# Patient Record
Sex: Male | Born: 1985 | Race: White | Hispanic: No | Marital: Single | Smoking: Current every day smoker
Health system: Southern US, Community
[De-identification: ages and names within clinical notes are randomized; demographics above are authoritative.]

---

## 2007-07-31 ENCOUNTER — Emergency Department: Payer: Self-pay | Admitting: Unknown Physician Specialty

## 2010-06-28 ENCOUNTER — Emergency Department: Payer: Self-pay | Admitting: Internal Medicine

## 2011-01-01 ENCOUNTER — Emergency Department: Payer: Self-pay | Admitting: Unknown Physician Specialty

## 2011-01-31 ENCOUNTER — Emergency Department: Payer: Self-pay | Admitting: Emergency Medicine

## 2011-08-20 ENCOUNTER — Emergency Department: Payer: Self-pay | Admitting: Emergency Medicine

## 2011-10-20 ENCOUNTER — Emergency Department: Payer: Self-pay | Admitting: Emergency Medicine

## 2012-12-03 ENCOUNTER — Emergency Department: Payer: Self-pay | Admitting: Emergency Medicine

## 2012-12-03 LAB — CBC
MCHC: 35.1 g/dL (ref 32.0–36.0)
MCV: 88 fL (ref 80–100)
Platelet: 248 10*3/uL (ref 150–440)
RDW: 13.3 % (ref 11.5–14.5)

## 2012-12-03 LAB — BASIC METABOLIC PANEL
Anion Gap: 6 — ABNORMAL LOW (ref 7–16)
BUN: 10 mg/dL (ref 7–18)
Calcium, Total: 9.4 mg/dL (ref 8.5–10.1)
Chloride: 105 mmol/L (ref 98–107)
Co2: 28 mmol/L (ref 21–32)
EGFR (African American): >60
EGFR (Non-African Amer.): >60
Glucose: 87 mg/dL (ref 65–99)
Sodium: 139 mmol/L (ref 136–145)

## 2012-12-03 LAB — TROPONIN I: Troponin-I: <0.02 ng/mL

## 2013-04-30 ENCOUNTER — Emergency Department: Payer: Self-pay | Admitting: Emergency Medicine

## 2013-09-23 ENCOUNTER — Emergency Department: Payer: Self-pay | Admitting: Emergency Medicine

## 2014-07-22 ENCOUNTER — Emergency Department: Payer: Self-pay | Admitting: Emergency Medicine

## 2014-10-20 ENCOUNTER — Encounter: Payer: Self-pay | Admitting: Urgent Care

## 2014-10-20 DIAGNOSIS — S40862A Insect bite (nonvenomous) of left upper arm, initial encounter: Secondary | ICD-10-CM | POA: Insufficient documentation

## 2014-10-20 DIAGNOSIS — S30861A Insect bite (nonvenomous) of abdominal wall, initial encounter: Secondary | ICD-10-CM | POA: Insufficient documentation

## 2014-10-20 DIAGNOSIS — Y9289 Other specified places as the place of occurrence of the external cause: Secondary | ICD-10-CM | POA: Insufficient documentation

## 2014-10-20 DIAGNOSIS — R221 Localized swelling, mass and lump, neck: Secondary | ICD-10-CM | POA: Insufficient documentation

## 2014-10-20 DIAGNOSIS — Y9389 Activity, other specified: Secondary | ICD-10-CM | POA: Insufficient documentation

## 2014-10-20 DIAGNOSIS — W57XXXA Bitten or stung by nonvenomous insect and other nonvenomous arthropods, initial encounter: Secondary | ICD-10-CM | POA: Insufficient documentation

## 2014-10-20 DIAGNOSIS — K011 Impacted teeth: Secondary | ICD-10-CM | POA: Insufficient documentation

## 2014-10-20 DIAGNOSIS — Y998 Other external cause status: Secondary | ICD-10-CM | POA: Insufficient documentation

## 2014-10-20 NOTE — ED Notes (Signed)
Patient presents with c/o bug bites to his LUE and LEFT flank area - reports that areas were turning black; no black areas noted in triage. Patient also c/o pain to bilateral jaws secondary to wisdom teeth - reports that it is making his tongue swell. NAD noted in triage - patient with speech WNL, able to speak in complete sentences, and no signs consistent with increased WOB or respiratory distress. NOS reported at this time.

## 2014-10-21 ENCOUNTER — Emergency Department
Admission: EM | Admit: 2014-10-21 | Discharge: 2014-10-21 | Discharge status: Against Medical Advice | Payer: Self-pay | Attending: Emergency Medicine | Admitting: Emergency Medicine

## 2014-11-09 ENCOUNTER — Emergency Department
Admission: EM | Admit: 2014-11-09 | Discharge: 2014-11-10 | Disposition: A | Discharge status: Home / Self Care | Payer: Self-pay | Attending: Emergency Medicine | Admitting: Emergency Medicine

## 2014-11-09 ENCOUNTER — Encounter: Payer: Self-pay | Admitting: Emergency Medicine

## 2014-11-09 DIAGNOSIS — K088 Other specified disorders of teeth and supporting structures: Secondary | ICD-10-CM | POA: Insufficient documentation

## 2014-11-09 DIAGNOSIS — K0889 Other specified disorders of teeth and supporting structures: Secondary | ICD-10-CM

## 2014-11-09 DIAGNOSIS — Z720 Tobacco use: Secondary | ICD-10-CM | POA: Insufficient documentation

## 2014-11-09 DIAGNOSIS — K121 Other forms of stomatitis: Secondary | ICD-10-CM | POA: Insufficient documentation

## 2014-11-09 NOTE — ED Notes (Signed)
Patient ambulatory to triage with steady gait, without difficulty or distress noted; pt reports mouth ulcers for last few days

## 2014-11-09 NOTE — ED Notes (Signed)
Pt here with ulcers in his mouth.  Pt states that they are also on his tongue.  Pt states that he has had them for 2 days.  Pt has not seen PCP, pt has not used any OTC meds. Pt talking in complete sentences and in NAD at this time

## 2014-11-10 MED ORDER — TRAMADOL HCL 50 MG PO TABS
50.0000 mg | ORAL_TABLET | Freq: Once | ORAL | Status: AC
  Administered 2014-11-10: 50 mg via ORAL

## 2014-11-10 MED ORDER — TRAMADOL HCL 50 MG PO TABS
ORAL_TABLET | ORAL | Status: AC
  Administered 2014-11-10: 50 mg via ORAL
  Filled 2014-11-10: qty 1

## 2014-11-10 MED ORDER — IBUPROFEN 800 MG PO TABS
ORAL_TABLET | ORAL | Status: AC
  Filled 2014-11-10: qty 1

## 2014-11-10 MED ORDER — IBUPROFEN 800 MG PO TABS
ORAL_TABLET | ORAL | Status: AC
  Administered 2014-11-10: 800 mg via ORAL
  Filled 2014-11-10: qty 1

## 2014-11-10 MED ORDER — TRAMADOL HCL 50 MG PO TABS: 50.0000 mg | ORAL_TABLET | Freq: Four times a day (QID) | ORAL | Status: DC | PRN

## 2014-11-10 MED ORDER — LIDOCAINE VISCOUS 2 % MT SOLN
OROMUCOSAL | Status: AC
  Filled 2014-11-10: qty 15

## 2014-11-10 MED ORDER — IBUPROFEN 800 MG PO TABS
800.0000 mg | ORAL_TABLET | Freq: Once | ORAL | Status: AC
  Administered 2014-11-10: 800 mg via ORAL

## 2014-11-10 MED ORDER — LIDOCAINE VISCOUS 2 % MT SOLN
15.0000 mL | Freq: Once | OROMUCOSAL | Status: AC
  Administered 2014-11-10: 15 mL via OROMUCOSAL

## 2014-11-10 MED ORDER — LIDOCAINE VISCOUS 2 % MT SOLN
OROMUCOSAL | Status: AC
Start: 2014-11-10 — End: 2014-11-10
  Administered 2014-11-10: 15 mL via OROMUCOSAL
  Filled 2014-11-10: qty 15

## 2014-11-10 MED ORDER — TRAMADOL HCL 50 MG PO TABS
ORAL_TABLET | ORAL | Status: AC
  Filled 2014-11-10: qty 1

## 2014-11-10 MED ORDER — LIDOCAINE VISCOUS 2 % MT SOLN: 15.0000 mL | OROMUCOSAL | Status: DC | PRN

## 2014-11-10 NOTE — Discharge Instructions (Signed)
Oral Ulcers Oral ulcers are painful, shallow sores around the lining of the mouth. They can affect the gums, the inside of the lips, and the cheeks. (Sores on the outside of the lips and on the face are different.) They typically first occur in school-aged children and teenagers. Oral ulcers may also be called canker sores or cold sores. CAUSES  Canker sores and cold sores can be caused by many factors including:  Infection.  Injury.  Sun exposure.  Medications.  Emotional stress.  Food allergies.  Vitamin deficiencies.  Toothpastes containing sodium lauryl sulfate. The herpes virus can be the cause of mouth ulcers. The first infection can be severe and cause 10 or more ulcers on the gums, tongue, and lips with fever and difficulty in swallowing. This infection usually occurs between the ages of 75 and 3 years.  SYMPTOMS  The typical sore is about  inch (6 mm) in size and is an oval or round ulcer with red borders. DIAGNOSIS  Your caregiver can diagnose simple oral ulcers by examination. Additional testing is usually not required.  TREATMENT  Treatment is aimed at pain relief. Generally, oral ulcers resolve by themselves within 1 to 2 weeks without medication and are not contagious unless caused by herpes (and other viruses). Antibiotics are not effective with mouth sores. Avoid direct contact with others until the ulcer is completely healed. See your caregiver for follow-up care as recommended. Also:  Offer a soft diet.  Encourage plenty of fluids to prevent dehydration. Popsicles and milk shakes can be helpful.  Avoid acidic and salty foods and drinks such as orange juice.  Infants and young children will often refuse to drink because of pain. Using a teaspoon, cup, or syringe to give small amounts of fluids frequently can help prevent dehydration.  Cold compresses on the face may help reduce pain.  Pain medication can help control soreness.  A solution of diphenhydramine  mixed with a liquid antacid can be useful to decrease the soreness of ulcers. Consult a caregiver for the dosing.  Liquids or ointments with a numbing ingredient may be helpful when used as recommended.  Older children and teenagers can rinse their mouth with a salt-water mixture (1/2 teaspoon of salt in 8 ounces of water) four times a day. This treatment is uncomfortable but may reduce the time the ulcers are present.  There are many over-the-counter throat lozenges and medications available for oral ulcers. Their effectiveness has not been studied.  Consult your medical caregiver prior to using homeopathic treatments for oral ulcers. SEEK MEDICAL CARE IF:   You think your child needs to be seen.  The pain worsens and you cannot control it.  There are 4 or more ulcers.  The lips and gums begin to bleed and crust.  A single mouth ulcer is near a tooth that is causing a toothache or pain.  Your child has a fever, swollen face, or swollen glands.  The ulcers began after starting a medication.  Mouth ulcers keep reoccurring or last more than 2 weeks.  You think your child is not taking adequate fluids. SEEK IMMEDIATE MEDICAL CARE IF:   Your child has a high fever.  Your child is unable to swallow or becomes dehydrated.  Your child looks or acts very ill.  An ulcer caused by a chemical your child accidentally put in their mouth. Document Released: 06/15/2004 Document Revised: 09/22/2013 Document Reviewed: 01/28/2009 [facility] Patient Information 2015 [place], [place]. This information is not intended to replace advice  given to you by your health care provider. Make sure you discuss any questions you have with your health care provider.  Dental Pain A tooth ache may be caused by cavities (tooth decay). Cavities expose the nerve of the tooth to air and hot or cold temperatures. It may come from an infection or abscess (also called a boil or furuncle) around your tooth. It is also  often caused by dental caries (tooth decay). This causes the pain you are having. DIAGNOSIS  Your caregiver can diagnose this problem by exam. TREATMENT   If caused by an infection, it may be treated with medications which kill germs (antibiotics) and pain medications as prescribed by your caregiver. Take medications as directed.  Only take over-the-counter or prescription medicines for pain, discomfort, or fever as directed by your caregiver.  Whether the tooth ache today is caused by infection or dental disease, you should see your dentist as soon as possible for further care. SEEK MEDICAL CARE IF: The exam and treatment you received today has been provided on an emergency basis only. This is not a substitute for complete medical or dental care. If your problem worsens or new problems (symptoms) appear, and you are unable to meet with your dentist, call or return to this location. SEEK IMMEDIATE MEDICAL CARE IF:   You have a fever.  You develop redness and swelling of your face, jaw, or neck.  You are unable to open your mouth.  You have severe pain uncontrolled by pain medicine. MAKE SURE YOU:   Understand these instructions.  Will watch your condition.  Will get help right away if you are not doing well or get worse. Document Released: 05/08/2005 Document Revised: 07/31/2011 Document Reviewed: 12/25/2007 [facility] Patient Information 2015 [place], [place]. This information is not intended to replace advice given to you by your health care provider. Make sure you discuss any questions you have with your health care provider.  Stomatitis  Stomatitis is redness, soreness, and puffiness (inflammation) of the lining of the mouth. This problem can also affect your cheeks, teeth, gums, lips, or tongue. Painful sores (ulcers) can also show up in the mouth. HOME CARE  Brush your teeth gently with a soft toothbrush.  Floss at least 2 times a day.  Clean your mouth after  eating.  Rinse your mouth with salt water 3 to 4 times a day.  Gargle with cold water.  Use medicated creams to lessen pain as told by your doctor.  Do not smoke or use chewing tobacco.  Avoid eating hot and spicy foods.  Eat soft and bland foods.  Lessen your stress.  Eat healthy foods. GET HELP RIGHT AWAY IF:  You have a fever.  You have pain, redness, or sores around one or both eyes.  You cannot eat or drink.  You feel tired, weak, or you pass out (faint).  You throw up (vomit), or you have watery poop (diarrhea).  You have chest pain, shortness of breath, or a fast and irregular heartbeat (pulse).  Your problems continue or get worse.  You have new problems.  You have mouth sores for longer than 3 weeks.  Your mouth sores come back often.  You stop feeling hungry or feel sick to your stomach (nauseous). MAKE SURE YOU:  Understand these instructions.  Will watch your condition.  Will get help right away if you are not doing well or get worse. Document Released: 04/27/2011 Document Revised: 07/31/2011 Document Reviewed: 04/27/2011 [employer] Patient Information 2015 [place]  [place]. This information is not intended to replace advice given to you by your health care provider. Make sure you discuss any questions you have with your health care provider.

## 2014-11-10 NOTE — ED Provider Notes (Signed)
[facility] Emergency Department Provider Note  ____________________________________________  Time seen: Approximately 0033 AM  I have reviewed the triage vital signs and the nursing notes.   HISTORY  Chief Complaint Mouth Lesions    HPI ADHRITH WIERMAN is a 29 y.o. male who comes in today with ulcers in his mouth and pain from his wisdom teeth. The patient reports that he has had ulcers in his mouth before but is never seen a moist tongue. The patient reports he is not taking anything for pain at home. He reports that he noticed the symptoms 2 days ago. He reports that the last time he was here he was told to stay away from Orajel mouthwash so he came in for something for pain. He reports that his pain is a 6 out of 10 in intensity. He also reports that his wisdom teeth are coming in and they're causing him some pain as well. The patient reports that the ulcers in his mouth or worse when he eats. He reports that he has a few on his time in a few on the inside of his lip and some in the back of his mouth as well.   History reviewed. No pertinent past medical history.  There are no active problems to display for this patient.   History reviewed. No pertinent past surgical history.  Current Outpatient Rx  Name  Route  Sig  Dispense  Refill  . lidocaine (XYLOCAINE) 2 % solution   Mouth/Throat   Use as directed 15 mLs in the mouth or throat every 4 (four) hours as needed for mouth pain (swish and spit).   100 mL   0   . traMADol (ULTRAM) 50 MG tablet   Oral   Take 1 tablet (50 mg total) by mouth every 6 (six) hours as needed.   12 tablet   0     Allergies Review of patient's allergies indicates no known allergies.  No family history on file.  Social History History  Substance Use Topics  . Smoking status: Current Every Day Smoker -- 1.00 packs/day    Types: Cigarettes  . Smokeless tobacco: Not on file  . Alcohol Use: No    Review of  Systems Constitutional: No fever/chills Eyes: No visual changes. ENT: Mouth ulcers and dental pain Cardiovascular: Denies chest pain. Respiratory: Denies shortness of breath. Gastrointestinal: No abdominal pain.  No nausea, no vomiting.  No diarrhea.  No constipation. Genitourinary: Negative for dysuria. Musculoskeletal: Negative for back pain. Skin: Negative for rash. Neurological: Negative for headaches,   10-point ROS otherwise negative.  ____________________________________________   PHYSICAL EXAM:  VITAL SIGNS: ED Triage Vitals  Enc Vitals Group     BP 11/09/14 2306 123/69 mmHg     Pulse Rate 11/09/14 2306 70     Resp 11/09/14 2306 17     Temp 11/09/14 2306 98.4 F (36.9 C)     Temp Source 11/09/14 2306 Oral     SpO2 11/09/14 2306 98 %     Weight 11/09/14 2306 168 lb (76.204 kg)     Height 11/09/14 2306 6' (1.829 m)     Head Cir --      Peak Flow --      Pain Score 11/09/14 2311 6     Pain Loc --      Pain Edu? --      Excl. in GC? --     Constitutional: Alert and oriented. Well appearing and in mild distress. Eyes:  Conjunctivae are normal. PERRL. EOMI. Head: Atraumatic. Nose: No congestion/rhinnorhea. Mouth/Throat: Mucosal ulcers on the inside of the patient's lower lip to ulcers noted in the tip of the patient's tCardiovascular: Normal rate, regular rhythm. Grossly normal heart sounds.  Good peripheral circulation. Respiratory: Normal respiratory effort.  No retractions. Lungs CTAB. Gastrointestinal: Soft and nontender. No distention. Positive bowel sounds Genitourinary: Deferred Musculoskeletal: No lower extremity tenderness nor edema.  No joint effusions. Neurologic:  Normal speech and language. No gross focal neurologic deficits are appreciated.  Skin:  Skin is warm, dry and intact. No rash noted. Psychiatric: Mood and affect are normal.   ____________________________________________   LABS (all labs ordered are listed, but only abnormal results are  displayed)  Labs Reviewed - No data to display ____________________________________________  EKG  None ____________________________________________  RADIOLOGY  None ____________________________________________   PROCEDURES  Procedure(s) performed: None  Critical Care performed: No  ____________________________________________   INITIAL IMPRESSION / ASSESSMENT AND PLAN / ED COURSE  Pertinent labs & imaging results that were available during my care of the patient were reviewed by me and considered in my medical decision making (see chart for details).  This is a 29 year old male who comes in with ulcers in his mouth. The patient reports he has some often. The ulcers do give the appearance of herpetic gingivostomatitis. I will give the patient some viscous lidocaine to swish and spit and give him some Toradol and tramadol as well for the pain. The patient will be discharged home to follow-up with [facility] care clinic ____________________________________________   FINAL CLINICAL IMPRESSION(S) / ED DIAGNOSES  Final diagnoses:  Mouth ulcers  Pain, dental      Rebecka Apley, MD 11/10/14 [phone]

## 2015-05-06 ENCOUNTER — Emergency Department
Admission: EM | Admit: 2015-05-06 | Discharge: 2015-05-06 | Disposition: A | Discharge status: Home / Self Care | Payer: Self-pay | Attending: Emergency Medicine | Admitting: Emergency Medicine

## 2015-05-06 ENCOUNTER — Emergency Department: Payer: Self-pay

## 2015-05-06 DIAGNOSIS — F17210 Nicotine dependence, cigarettes, uncomplicated: Secondary | ICD-10-CM | POA: Insufficient documentation

## 2015-05-06 DIAGNOSIS — R079 Chest pain, unspecified: Secondary | ICD-10-CM

## 2015-05-06 DIAGNOSIS — B029 Zoster without complications: Secondary | ICD-10-CM

## 2015-05-06 DIAGNOSIS — R61 Generalized hyperhidrosis: Secondary | ICD-10-CM | POA: Insufficient documentation

## 2015-05-06 LAB — CBC WITH DIFFERENTIAL/PLATELET
Basophils Absolute: 0.1 10*3/uL (ref 0–0.1)
Basophils Relative: 1 %
Eosinophils Absolute: 0.2 10*3/uL (ref 0–0.7)
Eosinophils Relative: 2 %
HEMATOCRIT: 47.3 % (ref 40.0–52.0)
Hemoglobin: 16.1 g/dL (ref 13.0–18.0)
LYMPHS PCT: 22 %
Lymphs Abs: 2.6 10*3/uL (ref 1.0–3.6)
MCH: 30.3 pg (ref 26.0–34.0)
MCHC: 34 g/dL (ref 32.0–36.0)
MCV: 89.4 fL (ref 80.0–100.0)
Monocytes Absolute: 1.2 10*3/uL — ABNORMAL HIGH (ref 0.2–1.0)
Monocytes Relative: 10 %
NEUTROS ABS: 7.9 10*3/uL — AB (ref 1.4–6.5)
Neutrophils Relative %: 65 %
Platelets: 241 10*3/uL (ref 150–440)
RBC: 5.3 MIL/uL (ref 4.40–5.90)
RDW: 13.3 % (ref 11.5–14.5)
WBC: 12 10*3/uL — AB (ref 3.8–10.6)

## 2015-05-06 LAB — BASIC METABOLIC PANEL
Anion gap: 4 — ABNORMAL LOW (ref 5–15)
BUN: 13 mg/dL (ref 6–20)
CALCIUM: 9.2 mg/dL (ref 8.9–10.3)
CO2: 30 mmol/L (ref 22–32)
Chloride: 100 mmol/L — ABNORMAL LOW (ref 101–111)
Creatinine, Ser: 0.91 mg/dL (ref 0.61–1.24)
GFR calc Af Amer: >60 mL/min (ref >60)
GFR calc non Af Amer: >60 mL/min (ref >60)
GLUCOSE: 89 mg/dL (ref 65–99)
Potassium: 4 mmol/L (ref 3.5–5.1)
Sodium: 134 mmol/L — ABNORMAL LOW (ref 135–145)

## 2015-05-06 LAB — TROPONIN I: Troponin I: <0.03 ng/mL (ref <0.031)

## 2015-05-06 MED ORDER — IBUPROFEN 800 MG PO TABS
800.0000 mg | ORAL_TABLET | Freq: Once | ORAL | Status: AC
  Administered 2015-05-06: 800 mg via ORAL
  Filled 2015-05-06: qty 1

## 2015-05-06 MED ORDER — ACYCLOVIR 200 MG PO CAPS
800.0000 mg | ORAL_CAPSULE | Freq: Once | ORAL | Status: AC
  Administered 2015-05-06: 800 mg via ORAL
  Filled 2015-05-06: qty 4

## 2015-05-06 MED ORDER — OXYCODONE-ACETAMINOPHEN 5-325 MG PO TABS: 1.0000 | ORAL_TABLET | Freq: Four times a day (QID) | ORAL | Status: DC | PRN

## 2015-05-06 MED ORDER — ACYCLOVIR 200 MG PO CAPS: 800.0000 mg | ORAL_CAPSULE | Freq: Every day | ORAL | Status: DC

## 2015-05-06 NOTE — ED Provider Notes (Signed)
[facility] Emergency Department Provider Note  ____________________________________________  Time seen: Approximately 315 AM  I have reviewed the triage vital signs and the nursing notes.   HISTORY  Chief Complaint Chest Pain    HPI Joe Maldonado is a 29 y.o. male without any chronic medical problems who is presenting today with central left-sided chest pain over the past 2 months. He says that the chest pain lasts for several seconds at a time and is worse when he is sitting or lying back. He says that it is pressure-like. Denies any shortness of breath and says he does have sweating with it sometimes. The patient says that he smokes but does not have any other chronic diseases. Denies any history of heart attacks in his family. Is concerned because he says his girlfriend, who is 60, died of a heart attack recently. He denies any illicit drug use and says he only drinks occasionally. He says also over the past 2 days he has noticed a rash to his back and also the left side of his chest. He says that it is a burning pain and he is experiencing to the rash and says that he sees blisters over the rash on his back. He says he has been very stressed since the death of his girlfriend 5 months ago and has not been sleeping recently.Denies that the chest pain is any worse with exertion.   No past medical history on file.  There are no active problems to display for this patient.   No past surgical history on file.  Current Outpatient Rx  Name  Route  Sig  Dispense  Refill  . lidocaine (XYLOCAINE) 2 % solution   Mouth/Throat   Use as directed 15 mLs in the mouth or throat every 4 (four) hours as needed for mouth pain (swish and spit).   100 mL   0   . traMADol (ULTRAM) 50 MG tablet   Oral   Take 1 tablet (50 mg total) by mouth every 6 (six) hours as needed.   12 tablet   0     Allergies Review of patient's allergies indicates no known allergies.  No  family history on file.  Social History Social History  Substance Use Topics  . Smoking status: Current Every Day Smoker -- 1.00 packs/day    Types: Cigarettes  . Smokeless tobacco: Not on file  . Alcohol Use: No    Review of Systems Constitutional: No fever/chills Eyes: No visual changes. ENT: No sore throat. Cardiovascular: As above Respiratory: Denies shortness of breath. Gastrointestinal: No abdominal pain.  No nausea, no vomiting.  No diarrhea.  No constipation. Genitourinary: Negative for dysuria. Musculoskeletal: Negative for back pain. Skin: As above  Neurological: Negative for headaches, focal weakness or numbness.  10-point ROS otherwise negative.  ____________________________________________   PHYSICAL EXAM:  VITAL SIGNS: ED Triage Vitals  Enc Vitals Group     BP 05/06/15 0306 117/77 mmHg     Pulse Rate 05/06/15 0306 91     Resp 05/06/15 0306 18     Temp 05/06/15 0306 98.4 F (36.9 C)     Temp Source 05/06/15 0306 Oral     SpO2 05/06/15 0306 99 %     Weight 05/06/15 0306 160 lb (72.576 kg)     Height 05/06/15 0306 6' (1.829 m)     Head Cir --      Peak Flow --      Pain Score 05/06/15 0306 8  Pain Loc --      Pain Edu? --      Excl. in GC? --     Constitutional: Alert and oriented. Well appearing and in no acute distress. Eyes: Conjunctivae are normal. PERRL. EOMI. Head: Atraumatic. Nose: No congestion/rhinnorhea. Mouth/Throat: Mucous membranes are moist.  Oropharynx non-erythematous. Neck: No stridor.   Cardiovascular: Normal rate, regular rhythm. Grossly normal heart sounds.  Good peripheral circulation. Respiratory: Normal respiratory effort.  No retractions. Lungs CTAB. Gastrointestinal: Soft and nontender. No distention. No abdominal bruits. No CVA tenderness. Musculoskeletal: No lower extremity tenderness nor edema.  No joint effusions. Neurologic:  Normal speech and language. No gross focal neurologic deficits are appreciated. No gait  instability. Skin:  Skin is warm, dry and intact. Dermatomal he distributed rash to the left thoracic back with vesicles. The rash is red and extends about 10 cm laterally starting at the midline. There is also another patch of erythematous, raised and rough rash just lateral to the left nipple which appears no same dermatome is on the back.  Psychiatric: Mood and affect are normal. Speech and behavior are normal.  ____________________________________________   LABS (all labs ordered are listed, but only abnormal results are displayed)  Labs Reviewed  CBC WITH DIFFERENTIAL/PLATELET - Abnormal; Notable for the following:    WBC 12.0 (*)    Neutro Abs 7.9 (*)    Monocytes Absolute 1.2 (*)    All other components within normal limits  BASIC METABOLIC PANEL - Abnormal; Notable for the following:    Sodium 134 (*)    Chloride 100 (*)    Anion gap 4 (*)    All other components within normal limits  TROPONIN I   ____________________________________________  EKG  ED ECG REPORT I, Arelia Longest, the attending physician, personally viewed and interpreted this ECG.   Date: 05/06/2015  EKG Time: 411  Rate: 79  Rhythm: normal EKG, normal sinus rhythm  Axis: Normal  Intervals:none  ST&T Change: Diffuse, concave ST elevation likely consistent with early repolarization. No abnormal T-wave inversions. No ST depression.  ____________________________________________  RADIOLOGY  No active cardiopulmonary disease. I personally reviewed this film   ____________________________________________   PROCEDURES   ____________________________________________   INITIAL IMPRESSION / ASSESSMENT AND PLAN / ED COURSE  Pertinent labs & imaging results that were available during my care of the patient were reviewed by me and considered in my medical decision making (see chart for details).  PERC negative.   ----------------------------------------- 4:47 AM on  05/06/2015 -----------------------------------------  Patient resting without any outward signs of distress but says that the left side of his chest and his back with her rashes is still hurting him. I will give him a prescriptive for acyclovir as well as Percocet. Chest pain highly unlikely to be life-threatening causes a blood clot or ACS. We'll treat shingles as well as give the patient follow-up with primary care. Will be discharged home. Patient says that he does not have a regular exposure to any young children or elderly people. He is aware that the rash is highly contagious and that known to be touching it and that if he touches it that he should be washing his hands afterwards. Patient says that he is also been taking a generic acid reducer for about 3 months for the chest pain which he says has not been helping. This lessens the probability of this is related to reflux. ____________________________________________   FINAL CLINICAL IMPRESSION(S) / ED DIAGNOSES  Uncomplicated herpes zoster. Chest  pain.    Myrna Blazeravid Matthew Phelan Schadt, MD 05/06/15 [phone]

## 2015-05-06 NOTE — ED Notes (Signed)
Pt in with co chest pain x 2 months and rash to back and chest x 2 days.

## 2016-05-07 IMAGING — CR DG CHEST 2V
1 series · 2 of 2 positions shown · non-contrast
Comparison: 12/03/2012

CLINICAL DATA: Chest pain. Rash in the back and chest for 2 days.
Smoker.

EXAM:
CHEST  2 VIEW

[Series 1: dg chest 2 view · 0.14mm/px · 2 of 2 slices shown]
[im 1/2]
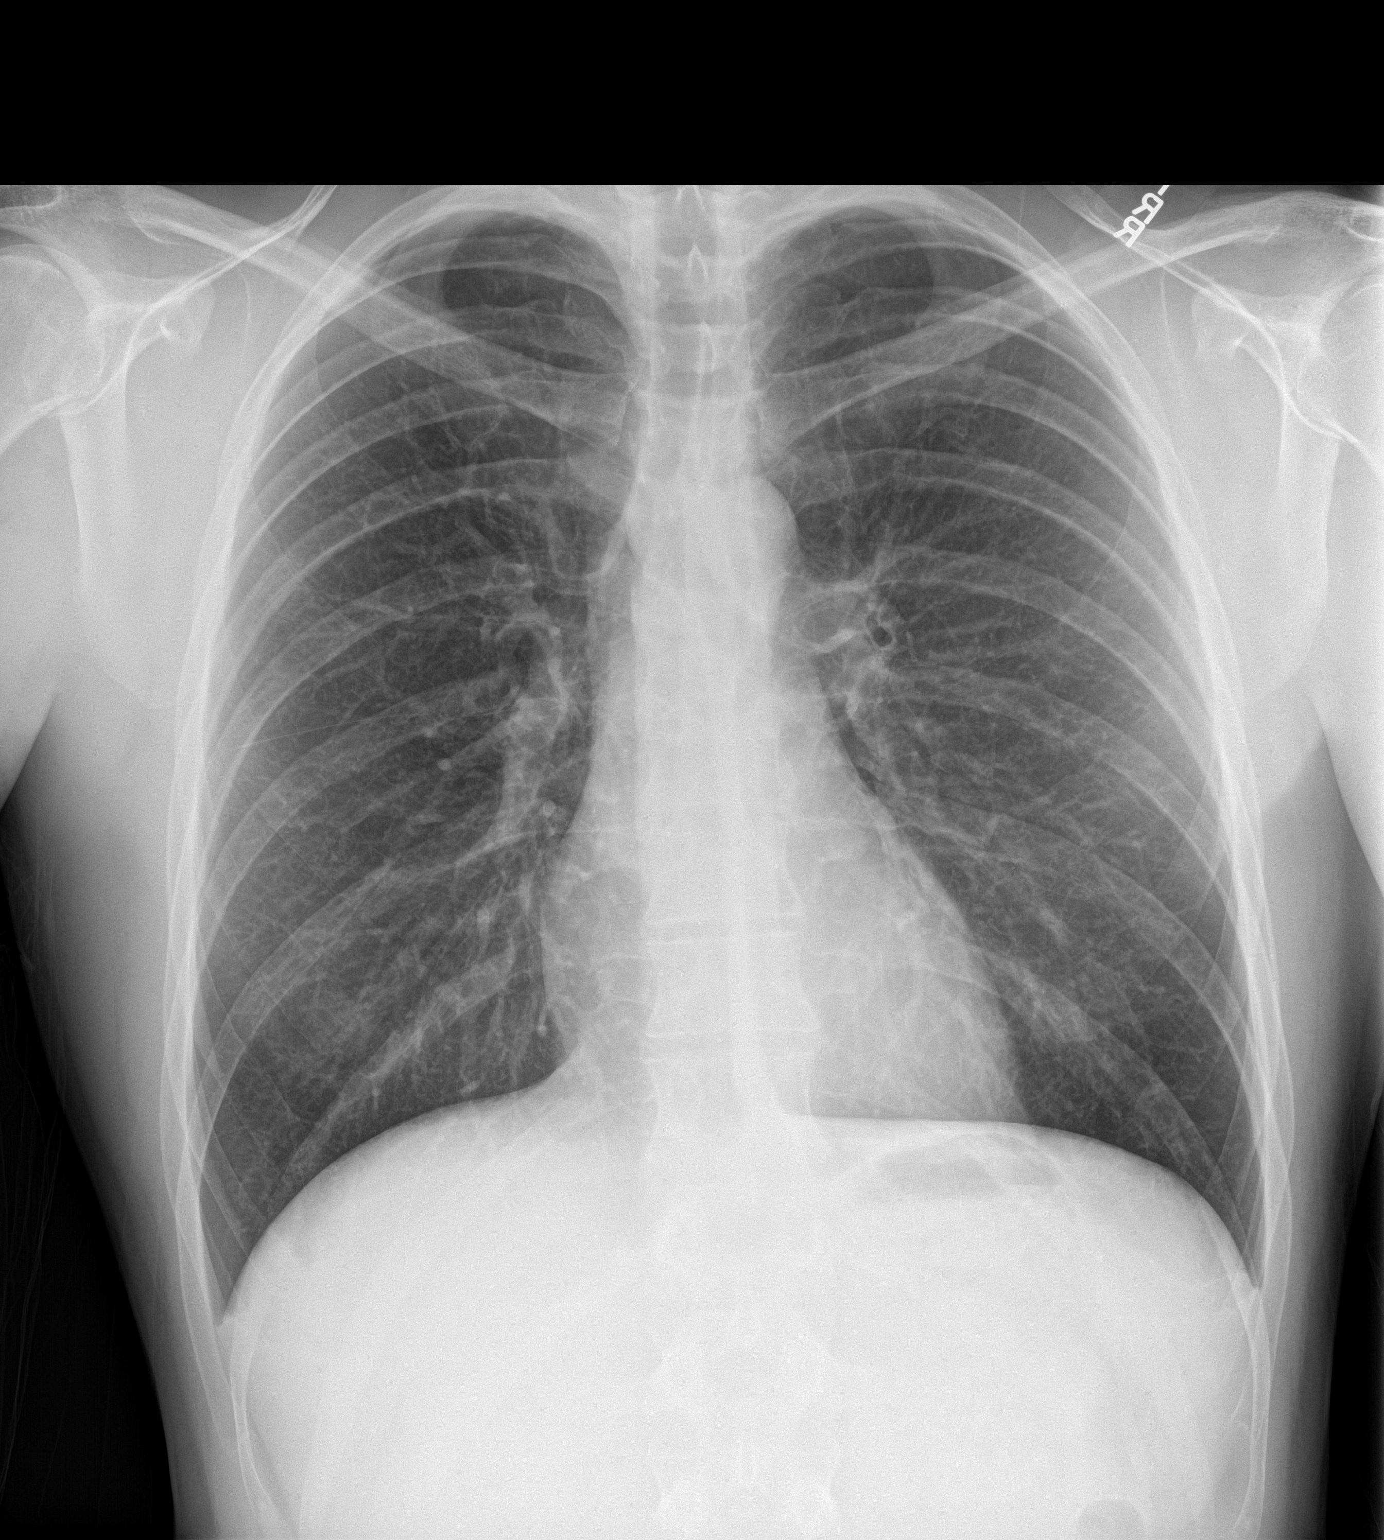
[im 2/2]
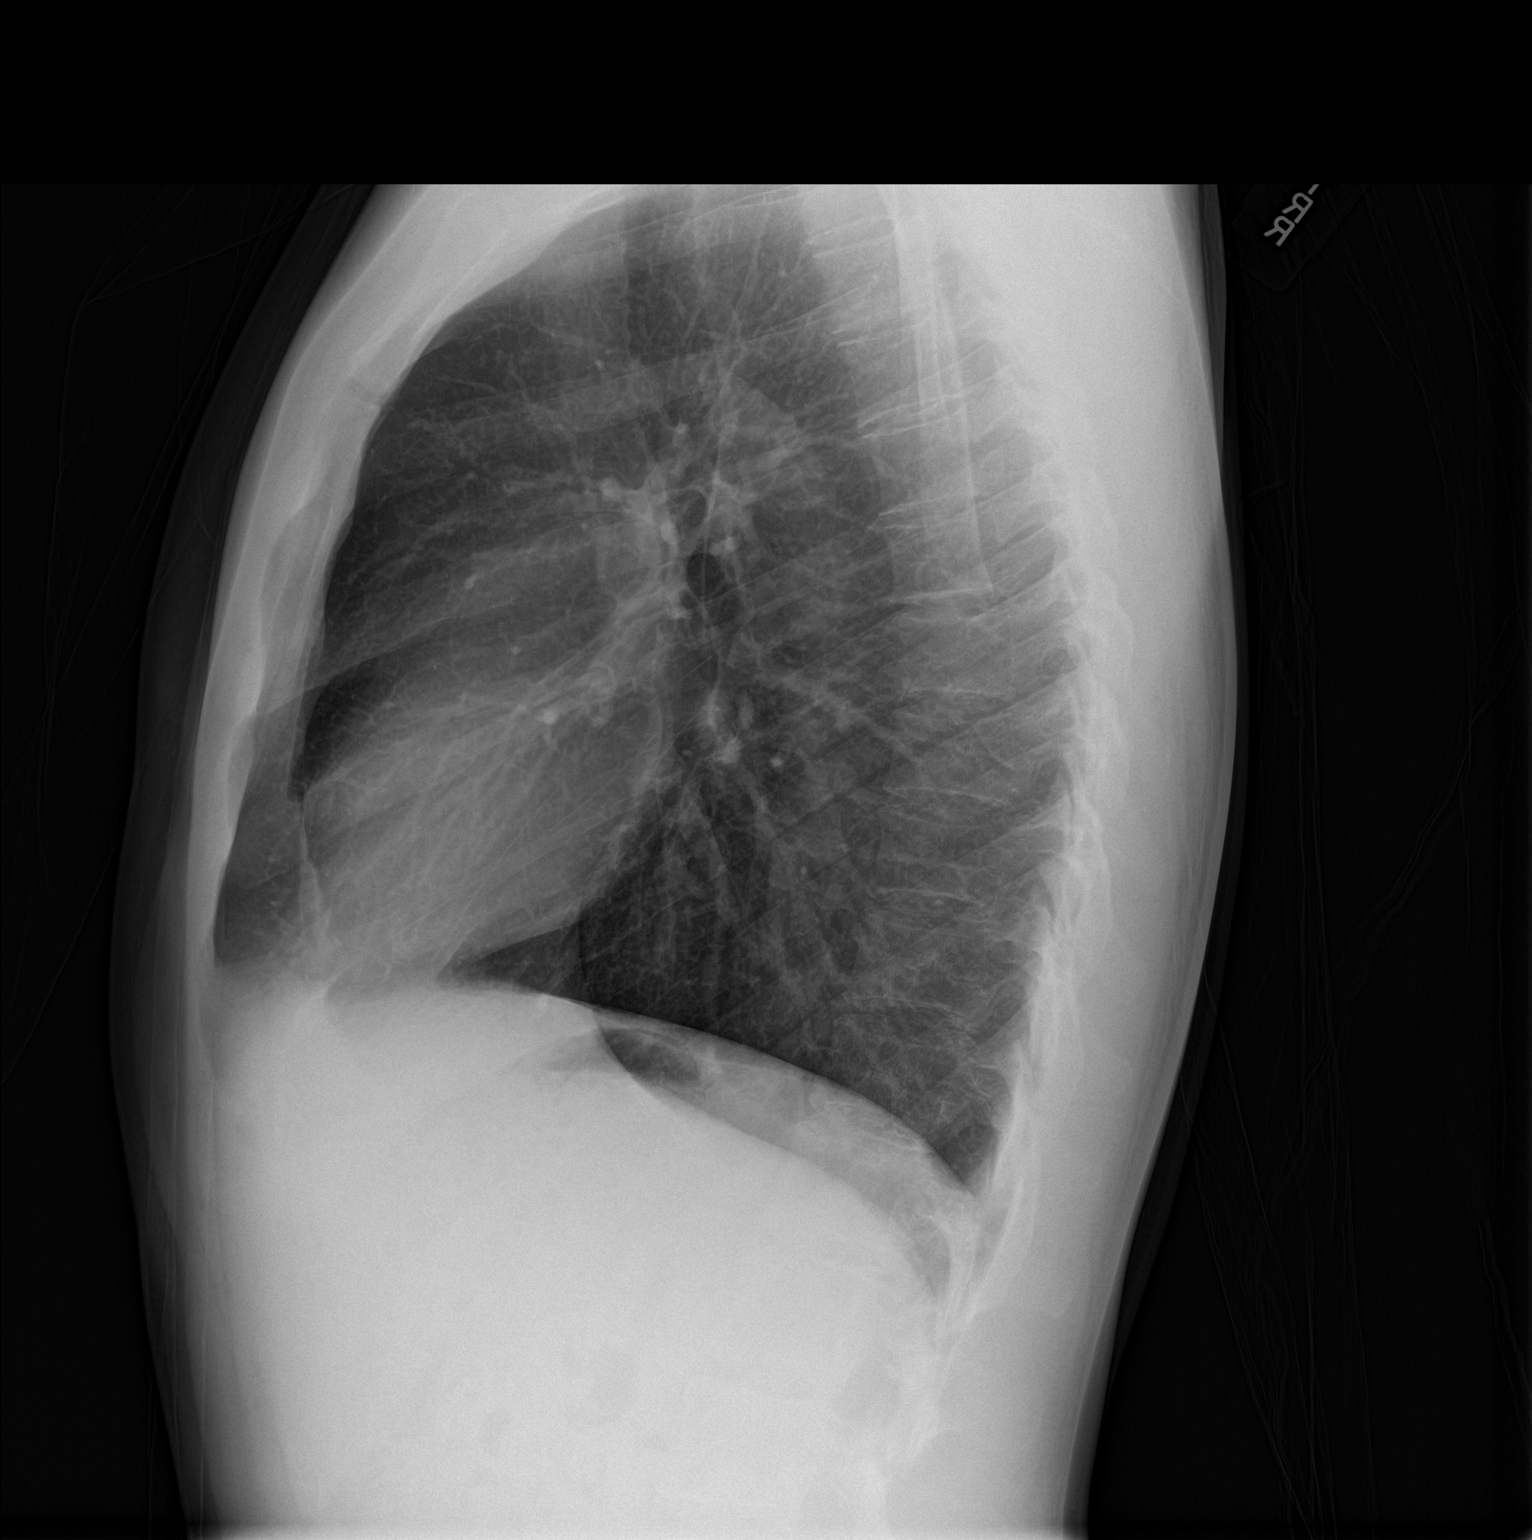

[2 of 2 positions shown; findings below may reference images not displayed]

FINDINGS: Pulmonary hyperinflation. Normal heart size and pulmonary
vascularity. No focal airspace disease or consolidation in the
lungs. No blunting of costophrenic angles. No pneumothorax.
Mediastinal contours appear intact.
IMPRESSION: No active cardiopulmonary disease.

## 2016-06-30 ENCOUNTER — Emergency Department
Admission: EM | Admit: 2016-06-30 | Discharge: 2016-06-30 | Disposition: A | Discharge status: Home / Self Care | Payer: Self-pay | Attending: Emergency Medicine | Admitting: Emergency Medicine

## 2016-06-30 ENCOUNTER — Encounter: Payer: Self-pay | Admitting: Emergency Medicine

## 2016-06-30 DIAGNOSIS — K0889 Other specified disorders of teeth and supporting structures: Secondary | ICD-10-CM

## 2016-06-30 DIAGNOSIS — K047 Periapical abscess without sinus: Secondary | ICD-10-CM | POA: Insufficient documentation

## 2016-06-30 DIAGNOSIS — F17210 Nicotine dependence, cigarettes, uncomplicated: Secondary | ICD-10-CM | POA: Insufficient documentation

## 2016-06-30 MED ORDER — CLINDAMYCIN HCL 300 MG PO CAPS: 300.0000 mg | ORAL_CAPSULE | Freq: Three times a day (TID) | ORAL | 0 refills | Status: AC

## 2016-06-30 MED ORDER — KETOROLAC TROMETHAMINE 10 MG PO TABS: 10.0000 mg | ORAL_TABLET | Freq: Four times a day (QID) | ORAL | 0 refills | Status: AC | PRN

## 2016-06-30 MED ORDER — CLINDAMYCIN HCL 150 MG PO CAPS
300.0000 mg | ORAL_CAPSULE | Freq: Once | ORAL | Status: AC
  Administered 2016-06-30: 300 mg via ORAL
  Filled 2016-06-30: qty 2

## 2016-06-30 MED ORDER — KETOROLAC TROMETHAMINE 30 MG/ML IJ SOLN
30.0000 mg | Freq: Once | INTRAMUSCULAR | Status: AC
  Administered 2016-06-30: 30 mg via INTRAMUSCULAR
  Filled 2016-06-30: qty 1

## 2016-06-30 NOTE — ED Triage Notes (Signed)
Pt reports left side dental abscess for the past 2-3 days.  No medications taken.  Drinking a pepsi in lobby. Swelling noted.

## 2016-06-30 NOTE — ED Notes (Signed)
See triage note .  States he has hx of dental carries  Developed some swelling and increased pain to left gumline

## 2016-06-30 NOTE — ED Provider Notes (Signed)
[facility] Emergency Department Provider Note  ____________________________________________  Time seen: Approximately 7:54 PM  I have reviewed the triage vital signs and the nursing notes.   HISTORY  Chief Complaint Dental Pain    HPI Joe SCHMELZLE is a 31 y.o. male presenting to the emergency department with a left upper jaw dental abscess. Patient localizes dental pain to superior 11. Patient has noticed erythema and gingival hypertrophy surrounding superior 11. He has also noticed left upper jaw edema. He has been afebrile. Patient states that he had a previous dental abscess in a similar location when he was in prison. Patient states that it "grew until it busted. It damaged nerves". No alleviating measures have been attempted.    History reviewed. No pertinent past medical history.  There are no active problems to display for this patient.   History reviewed. No pertinent surgical history.  Prior to Admission medications   Medication Sig Start Date End Date Taking? Authorizing Provider  acyclovir (ZOVIRAX) 200 MG capsule Take 4 capsules (800 mg total) by mouth 5 (five) times daily. 05/06/15   Myrna Blazer, MD  clindamycin (CLEOCIN) 300 MG capsule Take 1 capsule (300 mg total) by mouth 3 (three) times daily. 06/30/16 07/10/16  Orvil Feil, PA-C  ketorolac (TORADOL) 10 MG tablet Take 1 tablet (10 mg total) by mouth every 6 (six) hours as needed. 06/30/16 07/05/16  Orvil Feil, PA-C  lidocaine (XYLOCAINE) 2 % solution Use as directed 15 mLs in the mouth or throat every 4 (four) hours as needed for mouth pain (swish and spit). 11/10/14   Rebecka Apley, MD  oxyCODONE-acetaminophen (ROXICET) 5-325 MG tablet Take 1-2 tablets by mouth every 6 (six) hours as needed. 05/06/15   Myrna Blazer, MD  traMADol (ULTRAM) 50 MG tablet Take 1 tablet (50 mg total) by mouth every 6 (six) hours as needed. 11/10/14   Rebecka Apley, MD     Allergies Patient has no known allergies.  No family history on file.  Social History Social History  Substance Use Topics  . Smoking status: Current Every Day Smoker    Packs/day: 1.00    Types: Cigarettes  . Smokeless tobacco: Never Used  . Alcohol use No     Review of Systems  Constitutional: No fever/chills Eyes: No visual changes. No discharge ENT: Patient has dental pain and left upper jaw abscess Cardiovascular: no chest pain. Respiratory: no cough. No SOB. Gastrointestinal: No abdominal pain.  No nausea, no vomiting.  No diarrhea.  No constipation. Genitourinary: Negative for dysuria. No hematuria Musculoskeletal: Patient has left upper jaw pain. Skin: No erythema of the skin overlying the left upper jaw. Neurological: Negative for headaches, focal weakness or numbness.   ____________________________________________   PHYSICAL EXAM:  VITAL SIGNS: ED Triage Vitals  Enc Vitals Group     BP 06/30/16 1857 132/80     Pulse --      Resp 06/30/16 1857 15     Temp 06/30/16 1857 98.3 F (36.8 C)     Temp Source 06/30/16 1857 Oral     SpO2 06/30/16 1857 98 %     Weight 06/30/16 1856 178 lb (80.7 kg)     Height 06/30/16 1856 6' (1.829 m)     Head Circumference --      Peak Flow --      Pain Score --      Pain Loc --      Pain Edu? --  Excl. in GC? --    Constitutional: Alert and oriented. Patient is talkative and engaged.  Eyes: Palpebral and bulbar conjunctiva are nonerythematous bilaterally. PERRL. EOMI. No scleral icterus bilaterally. Head: Atraumatic. ENT:      Ears: Tympanic membranes are pearly bilaterally without effusion, erythema or purulent exudate. Bony landmarks are visualized bilaterally.       Nose: Skin overlying nares is without erythema. Nasal turbinates are non-erythematous. Nasal septum is midline.      Mouth/Throat: Mucous membranes are moist. Posterior pharynx is nonerythematous. No tonsillar exudate, hypertrophy or petechiae  visualized. Uvula is midline. Numerous dental carries visualized. Dental carries visualized at superior 11. Gingival hypertrophy and edema visualized surrounding superior 11. Patient has a palpable abscess at the left upper jaw. Neck: Full range of motion. No pain with neck flexion. Hematological/Lymphatic/Immunilogical: No cervical lymphadenopathy.  Cardiovascular: Normal rate, regular rhythm. Normal S1 and S2. No murmurs, gallops or rubs auscultated.  Respiratory:  On auscultation, adventitious sounds are absent.  Neurologic:  Normal for age. No gross focal neurologic deficits are appreciated.  Skin:  Patient has edema of the skin overlying the left upper jaw. No erythema. Psychiatric: Mood and affect are normal for age. Speech and behavior are normal.  ___________________________________________   LABS (all labs ordered are listed, but only abnormal results are displayed)  Labs Reviewed - No data to display ____________________________________________  EKG   ____________________________________________  RADIOLOGY   No results found.  ____________________________________________    PROCEDURES  Procedure(s) performed:    Procedures    Medications  clindamycin (CLEOCIN) capsule 300 mg (not administered)  ketorolac (TORADOL) 30 MG/ML injection 30 mg (30 mg Intramuscular Given 06/30/16 2001)     ____________________________________________   INITIAL IMPRESSION / ASSESSMENT AND PLAN / ED COURSE  Pertinent labs & imaging results that were available during my care of the patient were reviewed by me and considered in my medical decision making (see chart for details).  Review of the [place] CSRS was performed in accordance of the [facility] prior to dispensing any controlled drugs.     Assessment and Plan:  Dental abscess: Patient presents to the emergency department with dental pain at superior 11 with surrounding gingival hypertrophy and erythema. Patient has a left upper  jaw dental abscess. He was given Toradol in the emergency department. Patient was discharged with PO Toradol. Patient was advised to seek care with a dentist as soon as possible. All patient questions were answered. ____________________________________________  FINAL CLINICAL IMPRESSION(S) / ED DIAGNOSES  Final diagnoses:  Pain, dental  Dental abscess      NEW MEDICATIONS STARTED DURING THIS VISIT:  New Prescriptions   CLINDAMYCIN (CLEOCIN) 300 MG CAPSULE    Take 1 capsule (300 mg total) by mouth 3 (three) times daily.   KETOROLAC (TORADOL) 10 MG TABLET    Take 1 tablet (10 mg total) by mouth every 6 (six) hours as needed.        This chart was dictated using voice recognition software/Dragon. Despite best efforts to proofread, errors can occur which can change the meaning. Any change was purely unintentional.    Orvil FeilJaclyn M Woods, PA-C 06/30/16 2009    Sharyn CreamerMark Quale, MD 06/30/16 [phone]

## 2017-06-03 ENCOUNTER — Emergency Department
Admission: EM | Admit: 2017-06-03 | Discharge: 2017-06-03 | Disposition: A | Discharge status: Home / Self Care | Payer: Self-pay | Attending: Emergency Medicine | Admitting: Emergency Medicine

## 2017-06-03 ENCOUNTER — Encounter: Payer: Self-pay | Admitting: Emergency Medicine

## 2017-06-03 ENCOUNTER — Emergency Department: Payer: Self-pay

## 2017-06-03 DIAGNOSIS — R079 Chest pain, unspecified: Secondary | ICD-10-CM | POA: Insufficient documentation

## 2017-06-03 DIAGNOSIS — F17210 Nicotine dependence, cigarettes, uncomplicated: Secondary | ICD-10-CM | POA: Insufficient documentation

## 2017-06-03 LAB — CBC
HCT: 47.4 % (ref 40.0–52.0)
Hemoglobin: 16.1 g/dL (ref 13.0–18.0)
MCH: 30.2 pg (ref 26.0–34.0)
MCHC: 34 g/dL (ref 32.0–36.0)
MCV: 88.8 fL (ref 80.0–100.0)
Platelets: 273 10*3/uL (ref 150–440)
RBC: 5.34 MIL/uL (ref 4.40–5.90)
RDW: 13.5 % (ref 11.5–14.5)
WBC: 15.4 10*3/uL — AB (ref 3.8–10.6)

## 2017-06-03 LAB — BASIC METABOLIC PANEL
Anion gap: 9 (ref 5–15)
BUN: 11 mg/dL (ref 6–20)
CALCIUM: 9.2 mg/dL (ref 8.9–10.3)
CHLORIDE: 102 mmol/L (ref 101–111)
CO2: 27 mmol/L (ref 22–32)
Creatinine, Ser: 0.97 mg/dL (ref 0.61–1.24)
GFR calc non Af Amer: >60 mL/min (ref >60)
Glucose, Bld: 147 mg/dL — ABNORMAL HIGH (ref 65–99)
Potassium: 4.1 mmol/L (ref 3.5–5.1)
SODIUM: 138 mmol/L (ref 135–145)

## 2017-06-03 LAB — TROPONIN I: Troponin I: <0.03 ng/mL (ref <0.03)

## 2017-06-03 MED ORDER — FAMOTIDINE 40 MG PO TABS: 40.0000 mg | ORAL_TABLET | Freq: Every evening | ORAL | 0 refills | Status: DC

## 2017-06-03 NOTE — ED Triage Notes (Signed)
Patient with complaint of intermittent central chest pain with shortness of breath and nausea times two months.

## 2017-06-03 NOTE — Discharge Instructions (Signed)
Please follow up with cardiology for a stress test and further evaluation

## 2017-06-03 NOTE — ED Provider Notes (Signed)
[facility] Emergency Department Provider Note   ____________________________________________   First MD Initiated Contact with Patient 06/03/17 0139     (approximate)  I have reviewed the triage vital signs and the nursing notes.   HISTORY  Chief Complaint Chest Pain    HPI Joe Maldonado is a 32 y.o. male who comes into the hospital today with chest pains.  He reports that he has had a lot in the last week and they were really bad.  He denies any pain today or none now.  Patient states that the pain is in his mid chest and when it comes it lasts for about 30 seconds and then goes away.  He states that it usually occurs when he is at work but he is not working any harder than normal.  He reports that he does feel little short of breath but he is never taken anything for pain.  The patient has had some shortness of breath with it as well as some nausea.  He states that a few days ago he thought he was going to faint due to the pain.  He states that the pain went into his chest and he had some achiness.  His left arm also felt like it was going to fall off.  The patient states that the pain does not change with any deep inspiration.  He was concerned and wanted to get it checked out so he came into the hospital today.  Again he denies any pain today and states that he did not have any of the episodes today.  Some days he has it and he states that some days he does not.   History reviewed. No pertinent past medical history.  There are no active problems to display for this patient.   History reviewed. No pertinent surgical history.  Prior to Admission medications   Medication Sig Start Date End Date Taking? Authorizing Provider  acyclovir (ZOVIRAX) 200 MG capsule Take 4 capsules (800 mg total) by mouth 5 (five) times daily. 05/06/15   Myrna BlazerSchaevitz, David Matthew, MD  famotidine (PEPCID) 40 MG tablet Take 1 tablet (40 mg total) by mouth every evening. 06/03/17  06/03/18  Rebecka ApleyWebster, Allison P, MD  lidocaine (XYLOCAINE) 2 % solution Use as directed 15 mLs in the mouth or throat every 4 (four) hours as needed for mouth pain (swish and spit). 11/10/14   Rebecka ApleyWebster, Allison P, MD  oxyCODONE-acetaminophen (ROXICET) 5-325 MG tablet Take 1-2 tablets by mouth every 6 (six) hours as needed. 05/06/15   Schaevitz, Myra Rudeavid Matthew, MD  traMADol (ULTRAM) 50 MG tablet Take 1 tablet (50 mg total) by mouth every 6 (six) hours as needed. 11/10/14   Rebecka ApleyWebster, Allison P, MD    Allergies Patient has no known allergies.  No family history on file.  Social History Social History   Tobacco Use  . Smoking status: Current Every Day Smoker    Packs/day: 1.00    Types: Cigarettes  . Smokeless tobacco: Never Used  Substance Use Topics  . Alcohol use: Yes  . Drug use: Yes    Types: Marijuana    Comment: occ    Review of Systems  Constitutional: No fever/chills Eyes: No visual changes. ENT: No sore throat. Cardiovascular:  chest pain. Respiratory:  shortness of breath. Gastrointestinal: Nausea but No abdominal pain. no vomiting.  No diarrhea.  No constipation. Genitourinary: Negative for dysuria. Musculoskeletal: Negative for back pain. Skin: Negative for rash. Neurological: Negative for headaches, focal weakness  or numbness.   ____________________________________________   PHYSICAL EXAM:  VITAL SIGNS: ED Triage Vitals [06/03/17 0023]  Enc Vitals Group     BP 120/73     Pulse Rate 95     Resp 18     Temp 98.8 F (37.1 C)     Temp Source Oral     SpO2 99 %     Weight 180 lb (81.6 kg)     Height 6' (1.829 m)     Head Circumference      Peak Flow      Pain Score 0     Pain Loc      Pain Edu?      Excl. in GC?     Constitutional: Alert and oriented. Well appearing and in no acute distress. Eyes: Conjunctivae are normal. PERRL. EOMI. Head: Atraumatic. Nose: No congestion/rhinnorhea. Mouth/Throat: Mucous membranes are moist.  Oropharynx  non-erythematous. Cardiovascular: Normal rate, regular rhythm. Grossly normal heart sounds.  Good peripheral circulation. Respiratory: Normal respiratory effort.  No retractions. Lungs CTAB. Gastrointestinal: Soft and nontender. No distention. Positive bowel sounds Musculoskeletal: No lower extremity tenderness nor edema.   Neurologic:  Normal speech and language.  Skin:  Skin is warm, dry and intact. Psychiatric: Mood and affect are normal.   ____________________________________________   LABS (all labs ordered are listed, but only abnormal results are displayed)  Labs Reviewed  BASIC METABOLIC PANEL - Abnormal; Notable for the following components:      Result Value   Glucose, Bld 147 (*)    All other components within normal limits  CBC - Abnormal; Notable for the following components:   WBC 15.4 (*)    All other components within normal limits  TROPONIN I   ____________________________________________  EKG  ED ECG REPORT I, Rebecka Apley, the attending physician, personally viewed and interpreted this ECG.   Date: 06/03/2017  EKG Time: 0028  Rate: 74  Rhythm: normal sinus rhythm  Axis: normal  Intervals:none  ST&T Change: flipped t waves in lead III  ____________________________________________  RADIOLOGY  Dg Chest 2 View  Result Date: 06/03/2017 CLINICAL DATA:  32 year old male with chest pain. EXAM: CHEST  2 VIEW COMPARISON:  Chest radiograph dated 05/06/2015 FINDINGS: The heart size and mediastinal contours are within normal limits. Both lungs are clear. The visualized skeletal structures are unremarkable. IMPRESSION: No active cardiopulmonary disease. Electronically Signed   By: Elgie Collard M.D.   On: 06/03/2017 01:09    ____________________________________________   PROCEDURES  Procedure(s) performed: None  Procedures  Critical Care performed: No  ____________________________________________   INITIAL IMPRESSION / ASSESSMENT AND PLAN /  ED COURSE  As part of my medical decision making, I reviewed the following data within the electronic MEDICAL RECORD NUMBER Notes from prior ED visits and [place] Controlled Substance Database  This is a 32 year old male who comes into the hospital today with chest pain.  He states he has been having this intermittent chest pain for over a month.  He denies any pain today.  My differential diagnosis includes acute coronary syndrome, reflux, musculoskeletal pain.  The patient states that the pain is not pleuritic in nature.  We did check a CBC, BMP and troponin which is negative.  Since the patient has not had any pain since yesterday I did not feel that we needed to check more than one troponin.  The patient also had a chest x-ray that was unremarkable.  His white blood cell count was mildly elevated at 15.4  I informed the patient that although his workup is unremarkable at this time I feel that he needs to follow-up with cardiology for a stress test.  The patient will be discharged home and encouraged to follow-up with cardiology.  I will also place the patient on some Pepcid in the event that this is due to his reflux.      ____________________________________________   FINAL CLINICAL IMPRESSION(S) / ED DIAGNOSES  Final diagnoses:  Chest pain, unspecified type     ED Discharge Orders        Ordered    famotidine (PEPCID) 40 MG tablet  Every evening     06/03/17 0208       Note:  This document was prepared using Dragon voice recognition software and may include unintentional dictation errors.    Rebecka Apley, MD 06/03/17 [phone]

## 2017-06-12 ENCOUNTER — Telehealth: Payer: Self-pay

## 2017-06-12 NOTE — Telephone Encounter (Signed)
Pt was seen in ED on 06/03/17 for CP   Unable to contact patient on Phone   Sending letter to patient to call office and schedule appointment

## 2017-08-21 ENCOUNTER — Emergency Department
Admission: EM | Admit: 2017-08-21 | Discharge: 2017-08-21 | Disposition: A | Discharge status: Home / Self Care | Payer: Self-pay | Attending: Emergency Medicine | Admitting: Emergency Medicine

## 2017-08-21 ENCOUNTER — Encounter: Payer: Self-pay | Admitting: Emergency Medicine

## 2017-08-21 DIAGNOSIS — Z79899 Other long term (current) drug therapy: Secondary | ICD-10-CM | POA: Insufficient documentation

## 2017-08-21 DIAGNOSIS — F17210 Nicotine dependence, cigarettes, uncomplicated: Secondary | ICD-10-CM | POA: Insufficient documentation

## 2017-08-21 DIAGNOSIS — K029 Dental caries, unspecified: Secondary | ICD-10-CM | POA: Insufficient documentation

## 2017-08-21 DIAGNOSIS — K047 Periapical abscess without sinus: Secondary | ICD-10-CM | POA: Insufficient documentation

## 2017-08-21 MED ORDER — AMOXICILLIN 500 MG PO CAPS
500.0000 mg | ORAL_CAPSULE | Freq: Once | ORAL | Status: AC
  Administered 2017-08-21: 500 mg via ORAL
  Filled 2017-08-21: qty 1

## 2017-08-21 MED ORDER — AMOXICILLIN 500 MG PO CAPS: 500.0000 mg | ORAL_CAPSULE | Freq: Three times a day (TID) | ORAL | 0 refills | Status: DC

## 2017-08-21 NOTE — ED Triage Notes (Signed)
Patient with complaint of abscess tooth that started Friday. Patient states that he has been taking IBU but it has not improved.

## 2017-08-21 NOTE — Discharge Instructions (Signed)
Take the prescription antibiotic as directed. Rinse after every meal with warm-salty water. Take OTC ibuprofen for pain and inflammation. Follow-up with one of the dental clinics listed below:  OPTIONS FOR DENTAL FOLLOW UP CARE  [place] [facility] - Local Safety Net Dental Clinics [email or web address]   [facility] [phone])  [facility] [phone])  [place] [phone])  [facility] [phone])  [facility] [phone]) This clinic caters to the indigent population and is on a lottery system. Location: [employer] [facility], [employer], [address] [place] Clinic Hours: Wednesdays from 6pm - 9pm, patients seen by a lottery system. For dates, call or go to [email or web address] Services: Cleanings, fillings and simple extractions. Payment Options: DENTAL WORK IS FREE OF CHARGE. Bring proof of income or support. Best way to get seen: Arrive at 5:15 pm - this is a lottery, NOT first come/first serve, so arriving earlier will not increase your chances of being seen.     [facility] [phone] Select option 1 for emergencies   Location: [facility], [place], [address] [place] Clinic Hours: No walk-ins accepted - call the day before to schedule an appointment. Check in times are 9:30 am and 1:30 pm. Services: Simple extractions, temporary fillings, pulpectomy/pulp debridement, uncomplicated abscess drainage. Payment Options: PAYMENT IS DUE AT THE TIME OF SERVICE.  Fee is usually $100-200, additional surgical procedures (e.g. abscess drainage) may be extra. Cash, checks, Visa/MasterCard accepted.  Can file Medicaid if patient is covered for dental - patient should call case worker to check. No discount for [facility]  patients. Best way to get seen: MUST call the day before and get onto the schedule. Can usually be seen the next 1-2 days. No walk-ins accepted.     [facility] [phone]   Location: [facility], [address], [place] Clinic Hours: M, W, Th, F 8am or 1:30pm, Tues 9a or 1:30 - first come/first served. Services: Simple extractions, temporary fillings, uncomplicated abscess drainage.  You do not need to be an [facility] resident. Payment Options: PAYMENT IS DUE AT THE TIME OF SERVICE. Dental insurance, otherwise sliding scale - bring proof of income or support. Depending on income and treatment needed, cost is usually $50-200. Best way to get seen: Arrive early as it is first come/first served.     [facility] [phone]   Location: [address] Clinic Hours: Mon-Thu 8a-5p Services: Most basic dental services including extractions and fillings. Payment Options: PAYMENT IS DUE AT THE TIME OF SERVICE. Sliding scale, up to 50% off - bring proof if income or support. Medicaid with dental option accepted. Best way to get seen: Call to schedule an appointment, can usually be seen within 2 weeks OR they will try to see walk-ins - show up at 8a or 2p (you may have to wait).     [facility] [phone] [place] RESIDENTS ONLY   Location: [facility], [address], [place], [place] [address] Clinic Hours: By appointment only. Monday - Thursday 8am-5pm, Friday 8am-12pm Services: Cleanings, fillings, extractions. Payment Options: PAYMENT IS DUE AT THE TIME OF SERVICE. Cash, Visa or MasterCard. Sliding scale - $30 minimum per service. Best way to get seen: Come in to office, complete packet and make an appointment - need proof of income or support monies for each household member  and proof of [place] residence. Usually takes about a month to  get in.     [facility] [phone]   Location: [address] [place] [facility] Hours: Walk-in Urgent Care Dental Services are offered Monday-Friday mornings only. The numbers of emergencies accepted daily is limited to the number of providers available. Maximum 15 - Mondays, Wednesdays & Thursdays Maximum 10 - Tuesdays & Fridays Services: You do not need to be [place] resident to be seen for a dental emergency. Emergencies are defined as pain, swelling, abnormal bleeding, or dental trauma. Walkins will receive x-rays if needed. NOTE: Dental cleaning is not an emergency. Payment Options: PAYMENT IS DUE AT THE TIME OF SERVICE. Minimum co-pay is $40.00 for uninsured patients. Minimum co-pay is $3.00 for Medicaid with dental coverage. Dental Insurance is accepted and must be presented at time of visit. Medicare does not cover dental. Forms of payment: Cash, credit card, checks. Best way to get seen: If not previously registered with the clinic, walk-in dental registration begins at 7:15 am and is on a first come/first serve basis. If previously registered with the clinic, call to make an appointment.     [facility] [phone] [place] RESIDENTS ONLY   Location: [address], [place], [place] Clinic Hours: Mon-Thu 10a-2p Services: Extractions only! Payment Options: FREE (donations accepted) - bring proof of income or support Best way to get seen: Call and schedule an appointment OR come at 8am on the 1st Monday of every month (except for holidays) when it is first come/first served.     [facility] [phone]   Location: [address], [place] Clinic Hours: Friday mornings Services, Payment Options, Best way to get seen: Call for info

## 2017-08-21 NOTE — ED Provider Notes (Signed)
[facility] Emergency Department Provider Note ____________________________________________  Time seen: 2155  I have reviewed the triage vital signs and the nursing notes.  HISTORY  Chief Complaint  Dental Pain  HPI Joe Maldonado is a 32 y.o. male presents to the ED for evaluation of sudden left facial swelling due to a dental abscess. He reports he awoke this morning with facial swelling and dental pain. He admits to a dental cavity at the left upper canine. He dosed ibuprofen on schedule, which has resolved the majority of the initial facial swelling. He notes poor dentition generally. He is without fevers, chills, sweats, nausea, or difficulty breathing. He also denies any spontaneous drainage from the mouth.   History reviewed. No pertinent past medical history.  There are no active problems to display for this patient.  History reviewed. No pertinent surgical history.  Prior to Admission medications   Medication Sig Start Date End Date Taking? Authorizing Provider  acyclovir (ZOVIRAX) 200 MG capsule Take 4 capsules (800 mg total) by mouth 5 (five) times daily. 05/06/15   Schaevitz, Myra Rude, MD  amoxicillin (AMOXIL) 500 MG capsule Take 1 capsule (500 mg total) by mouth 3 (three) times daily. 08/21/17   Abia Monaco, Charlesetta Ivory, PA-C  famotidine (PEPCID) 40 MG tablet Take 1 tablet (40 mg total) by mouth every evening. 06/03/17 06/03/18  Rebecka Apley, MD  lidocaine (XYLOCAINE) 2 % solution Use as directed 15 mLs in the mouth or throat every 4 (four) hours as needed for mouth pain (swish and spit). 11/10/14   Rebecka Apley, MD  oxyCODONE-acetaminophen (ROXICET) 5-325 MG tablet Take 1-2 tablets by mouth every 6 (six) hours as needed. 05/06/15   Schaevitz, Myra Rude, MD  traMADol (ULTRAM) 50 MG tablet Take 1 tablet (50 mg total) by mouth every 6 (six) hours as needed. 11/10/14   Rebecka Apley, MD    Allergies Patient has no known  allergies.  No family history on file.  Social History Social History   Tobacco Use  . Smoking status: Current Every Day Smoker    Packs/day: 1.00    Types: Cigarettes  . Smokeless tobacco: Never Used  Substance Use Topics  . Alcohol use: Yes    Comment: occ  . Drug use: Not Currently    Comment: occ    Review of Systems  Constitutional: Negative for fever. Eyes: Negative for visual changes. ENT: Negative for sore throat. Dental pain as above Cardiovascular: Negative for chest pain. Respiratory: Negative for shortness of breath. Gastrointestinal: Negative for abdominal pain, vomiting and diarrhea. Musculoskeletal: Negative for back pain. Skin: Negative for rash. Neurological: Negative for headaches, focal weakness or numbness. ____________________________________________  PHYSICAL EXAM:  VITAL SIGNS: ED Triage Vitals  Enc Vitals Group     BP 08/21/17 2046 134/65     Pulse Rate 08/21/17 2046 81     Resp 08/21/17 2046 18     Temp 08/21/17 2046 97.9 F (36.6 C)     Temp Source 08/21/17 2046 Oral     SpO2 08/21/17 2046 100 %     Weight 08/21/17 2046 170 lb (77.1 kg)     Height 08/21/17 2046 6' (1.829 m)     Head Circumference --      Peak Flow --      Pain Score 08/21/17 2050 9     Pain Loc --      Pain Edu? --      Excl. in GC? --  Constitutional: Alert and oriented. Well appearing and in no distress. Head: Normocephalic and atraumatic, except for mild, subtle swelling to the left nasolabial fold. Eyes: Conjunctivae are normal. PERRL. Normal extraocular movements Nose: No congestion/rhinorrhea/epistaxis. Mouth/Throat: Mucous membranes are moist. Uvula is midline and tonsils are flat. Dental cavity noted to the left upper canine. No gumline swelling noted. No pointing or fluctuance appreciated.  Neck: Supple. No thyromegaly. Hematological/Lymphatic/Immunological: No cervical lymphadenopathy. Cardiovascular: Normal rate, regular rhythm. Normal distal  pulses. Respiratory: Normal respiratory effort. No wheezes/rales/rhonchi. Musculoskeletal: Nontender with normal range of motion in all extremities.  Neurologic:  Normal gait without ataxia. Normal speech and language. No gross focal neurologic deficits are appreciated. Skin:  Skin is warm, dry and intact. No rash noted. ____________________________________________  PROCEDURES  Procedures Amoxicillin 500 mg PO ____________________________________________  INITIAL IMPRESSION / ASSESSMENT AND PLAN / ED COURSE  Patient with ED evaluation of facial swelling due to dental abscess. His exam is stable at this time. He will be discharged with a prescription for amoxicillin. He is referred to the local community clinics for follow-up. ____________________________________________  FINAL CLINICAL IMPRESSION(S) / ED DIAGNOSES  Final diagnoses:  Pain due to dental caries  Dental abscess     Lissa HoardMenshew, Trinidad Ingle V Bacon, PA-C 08/21/17 2120    Sharman CheekStafford, Phillip, MD 08/24/17 [phone]

## 2017-08-21 NOTE — ED Notes (Signed)
Patient with pain and swelling to left upper tooth. Patient reports that the pain and swelling began on Friday. Patient reports that he has been taking IBU and using salt water. Patient reports that the swelling has gone down but continues to have pain.

## 2018-06-17 ENCOUNTER — Encounter: Payer: Self-pay | Admitting: Emergency Medicine

## 2018-06-17 ENCOUNTER — Other Ambulatory Visit: Payer: Self-pay

## 2018-06-17 ENCOUNTER — Emergency Department
Admission: EM | Admit: 2018-06-17 | Discharge: 2018-06-17 | Disposition: A | Discharge status: Home / Self Care | Payer: Self-pay | Attending: Emergency Medicine | Admitting: Emergency Medicine

## 2018-06-17 DIAGNOSIS — L230 Allergic contact dermatitis due to metals: Secondary | ICD-10-CM

## 2018-06-17 DIAGNOSIS — L309 Dermatitis, unspecified: Secondary | ICD-10-CM | POA: Insufficient documentation

## 2018-06-17 DIAGNOSIS — L239 Allergic contact dermatitis, unspecified cause: Secondary | ICD-10-CM | POA: Insufficient documentation

## 2018-06-17 DIAGNOSIS — F17210 Nicotine dependence, cigarettes, uncomplicated: Secondary | ICD-10-CM | POA: Insufficient documentation

## 2018-06-17 MED ORDER — PREDNISONE 10 MG PO TABS: ORAL_TABLET | ORAL | 0 refills | Status: DC

## 2018-06-17 MED ORDER — TRIAMCINOLONE ACETONIDE 0.5 % EX OINT: 1.0000 "application " | TOPICAL_OINTMENT | Freq: Two times a day (BID) | CUTANEOUS | 1 refills | Status: DC

## 2018-06-17 NOTE — ED Provider Notes (Signed)
[facility] Emergency Department Provider Note  ____________________________________________   First MD Initiated Contact with Patient 06/17/18 [phone]     (approximate)  I have reviewed the triage vital signs and the nursing notes.   HISTORY  Chief Complaint Rash   HPI Joe Maldonado is a 33 y.o. male   presents to the ED with concerns that he may have shingles.  Patient states that he noticed an area to his lower abdomen approximately 2 weeks ago.  He states that this area is itchy and seems to be getting worse.  He has not applied any over-the-counter medication to it.  He also has noticed an area to his right foot that seems to be breaking out.  Patient states he has had the shingles on his back and apparently gets something similar to this every year.   History reviewed. No pertinent past medical history.  There are no active problems to display for this patient.   History reviewed. No pertinent surgical history.  Prior to Admission medications   Medication Sig Start Date End Date Taking? Authorizing Provider  predniSONE (DELTASONE) 10 MG tablet Take 6 tablets  today, on day 2 take 5 tablets, day 3 take 4 tablets, day 4 take 3 tablets, day 5 take  2 tablets and 1 tablet the last day 06/17/18   Tommi Rumps, PA-C  triamcinolone ointment (KENALOG) 0.5 % Apply 1 application topically 2 (two) times daily. 06/17/18   Tommi Rumps, PA-C    Allergies Patient has no known allergies.  No family history on file.  Social History Social History   Tobacco Use  . Smoking status: Current Every Day Smoker    Packs/day: 1.00    Types: Cigarettes  . Smokeless tobacco: Never Used  Substance Use Topics  . Alcohol use: Yes    Comment: occ  . Drug use: Not Currently    Comment: occ    Review of Systems Constitutional: No fever/chills Cardiovascular: Denies chest pain. Respiratory: Denies shortness of breath. Musculoskeletal: Negative for muscle  aches. Skin: Positive for skin rash. Neurological: Negative for headaches, focal weakness or numbness. ____________________________________________   PHYSICAL EXAM:  VITAL SIGNS: ED Triage Vitals  Enc Vitals Group     BP 06/17/18 0905 (!) 123/92     Pulse Rate 06/17/18 0905 91     Resp 06/17/18 0905 18     Temp 06/17/18 0905 98.3 F (36.8 C)     Temp Source 06/17/18 0905 Oral     SpO2 06/17/18 0905 99 %     Weight 06/17/18 0906 165 lb (74.8 kg)     Height 06/17/18 0906 6' (1.829 m)     Head Circumference --      Peak Flow --      Pain Score 06/17/18 0906 7     Pain Loc --      Pain Edu? --      Excl. in GC? --    Constitutional: Alert and oriented. Well appearing and in no acute distress. Eyes: Conjunctivae are normal. PERRL. EOMI. Head: Atraumatic. Nose: No congestion/rhinnorhea.  Neck: No stridor.   Cardiovascular: Normal rate, regular rhythm. Grossly normal heart sounds.  Good peripheral circulation. Respiratory: Normal respiratory effort.  No retractions. Lungs CTAB. Musculoskeletal: Moves upper and lower extremities without any difficulty.  Normal gait was noted. Neurologic:  Normal speech and language. No gross focal neurologic deficits are appreciated. No gait instability. Skin:  Skin is warm.  Just above the umbilicus  there is a patch of erythema with open papules but no active drainage.  There are no linear rashes or involvement of the back.  It was noted that just below this area patient has a new belt that he got for Christmas.  Most likely this is nickel plated.  The rash that he has lines up directly over the buccal that he has.  On examination of the right foot dorsal area there is a dry 3 cm patchy area with irregular borders.  No papules or vesicles noted.  Nontender to palpation.  Areas consistent with eczema. Psychiatric: Mood and affect are normal. Speech and behavior are normal.  ____________________________________________   LABS (all labs ordered are  listed, but only abnormal results are displayed)  Labs Reviewed - No data to display  PROCEDURES  Procedure(s) performed: None  Procedures  Critical Care performed: No  ____________________________________________   INITIAL IMPRESSION / ASSESSMENT AND PLAN / ED COURSE  As part of my medical decision making, I reviewed the following data within the electronic MEDICAL RECORD NUMBER Notes from prior ED visits and [place] Controlled Substance Database  Patient presents with 2 separate rashes both that have been approximately for 2 weeks.  The rash on his abdomen is directly above a belt that he received for Christmas.  There are no other areas on his abdomen or back and rash is not suspicious for herpes zoster.  Most likely this is a reaction to nickel.  Right foot dermatitis is consistent with eczema.  Patient was given a prescription for Kenalog cream to apply to area twice daily and a steroid taper.  Patient is to follow-up with either [facility] or Dr. Adolphus Birchwoodasher if a dermatologist is needed and he is not improving.  ____________________________________________   FINAL CLINICAL IMPRESSION(S) / ED DIAGNOSES  Final diagnoses:  Allergic contact dermatitis due to metals  Eczema, unspecified type     ED Discharge Orders         Ordered    triamcinolone ointment (KENALOG) 0.5 %  2 times daily     06/17/18 1011    predniSONE (DELTASONE) 10 MG tablet     06/17/18 1011           Note:  This document was prepared using Dragon voice recognition software and may include unintentional dictation errors.    Tommi RumpsSummers, Cesareo Vickrey L, PA-C 06/17/18 1208    Dionne BucySiadecki, Sebastian, MD 06/18/18 [phone]

## 2018-06-17 NOTE — ED Triage Notes (Signed)
Rash R lower abdomen x 2 weeks. States he gets them every year. States is painful.

## 2018-06-17 NOTE — ED Notes (Signed)
See triage note  Presents with possible shingle out break  Noticed area to lower abd and foot hx of same

## 2018-06-17 NOTE — Discharge Instructions (Addendum)
Discontinue wearing the belt that you have on today or wear clothing between the belt and your skin so that there is no direct contact.  Allow your skin to completely clear before wearing anything with nickel in it again.  Use the cream twice a day to the area and you may also use it to your foot which appears to be eczema.  Begin taking prednisone as directed.  If not improving or any worsening contact 1 of the dermatology offices listed on your discharge papers.

## 2019-01-21 ENCOUNTER — Other Ambulatory Visit: Payer: Self-pay

## 2019-01-21 ENCOUNTER — Emergency Department
Admission: EM | Admit: 2019-01-21 | Discharge: 2019-01-21 | Disposition: A | Discharge status: Home / Self Care | Payer: Self-pay | Attending: Emergency Medicine | Admitting: Emergency Medicine

## 2019-01-21 DIAGNOSIS — R21 Rash and other nonspecific skin eruption: Secondary | ICD-10-CM | POA: Insufficient documentation

## 2019-01-21 DIAGNOSIS — F17210 Nicotine dependence, cigarettes, uncomplicated: Secondary | ICD-10-CM | POA: Insufficient documentation

## 2019-01-21 MED ORDER — PERMETHRIN 5 % EX CREA: TOPICAL_CREAM | CUTANEOUS | 1 refills | Status: DC

## 2019-01-21 MED ORDER — CEPHALEXIN 500 MG PO CAPS: 500.0000 mg | ORAL_CAPSULE | Freq: Four times a day (QID) | ORAL | 0 refills | Status: AC

## 2019-01-21 MED ORDER — DIPHENHYDRAMINE HCL 25 MG PO CAPS: 25.0000 mg | ORAL_CAPSULE | ORAL | 0 refills | Status: DC | PRN

## 2019-01-21 NOTE — ED Provider Notes (Signed)
[facility] Emergency Department Provider Note  ____________________________________________  Time seen: Approximately 2:41 PM  I have reviewed the triage vital signs and the nursing notes.   HISTORY  Chief Complaint Rash    HPI Joe Maldonado is a 33 y.o. male that presents to the emergency department for concerns of rash to bilateral arms.  Patient also has 1 spot to his neck and one spot to his leg.  Patient is concerned that rash is from a mite or worm.  He states that he could see a worm earlier but is unable to visualize any currently.  Patient poured a cleaner on his arms to try to kill any insect.  He had a similar appearance to his arms after using latex gloves at work about 1 month ago.  After he discontinued using gloves, rash improved but has returned again.  He originally thought that rash was poison ivy as well but rash never went away. Girlfriend who is with patient has also seen what looks like bugs to patient's arms.  She does not have a similar rash.  They do not share a bed.  Patient has a history of scabies.  No known bedbugs.  No new lotions, detergents.  History reviewed. No pertinent past medical history.  There are no active problems to display for this patient.   History reviewed. No pertinent surgical history.  Prior to Admission medications   Medication Sig Start Date End Date Taking? Authorizing Provider  cephALEXin (KEFLEX) 500 MG capsule Take 1 capsule (500 mg total) by mouth 4 (four) times daily for 10 days. 01/21/19 01/31/19  Laban Emperor, PA-C  diphenhydrAMINE (BENADRYL) 25 mg capsule Take 1 capsule (25 mg total) by mouth every 4 (four) hours as needed. 01/21/19 01/21/20  Laban Emperor, PA-C  permethrin (ELIMITE) 5 % cream Message cream thoroughly into skin from neck to soles of feet, including under nails and to scalp and face. Wash after 8-14 hours. Repeat in 2 weeks. 01/21/19   Laban Emperor, PA-C  predniSONE (DELTASONE) 10 MG  tablet Take 6 tablets  today, on day 2 take 5 tablets, day 3 take 4 tablets, day 4 take 3 tablets, day 5 take  2 tablets and 1 tablet the last day 06/17/18   Johnn Hai, PA-C  triamcinolone ointment (KENALOG) 0.5 % Apply 1 application topically 2 (two) times daily. 06/17/18   Johnn Hai, PA-C    Allergies Patient has no known allergies.  No family history on file.  Social History Social History   Tobacco Use  . Smoking status: Current Every Day Smoker    Packs/day: 1.00    Types: Cigarettes  . Smokeless tobacco: Never Used  Substance Use Topics  . Alcohol use: Yes    Comment: occ  . Drug use: Not Currently    Comment: occ     Review of Systems  Constitutional: No fever/chills Gastrointestinal: No nausea, no vomiting.  Musculoskeletal: Negative for musculoskeletal pain. Skin: Negative for abrasions, lacerations, ecchymosis.  Positive for rash.   ____________________________________________   PHYSICAL EXAM:  VITAL SIGNS: ED Triage Vitals  Enc Vitals Group     BP 01/21/19 1354 125/80     Pulse Rate 01/21/19 1354 (!) 115     Resp 01/21/19 1354 18     Temp 01/21/19 1354 97.8 F (36.6 C)     Temp Source 01/21/19 1354 Oral     SpO2 01/21/19 1354 97 %     Weight 01/21/19 1406 142 lb (  64.4 kg)     Height 01/21/19 1406 5\' 9"  (1.753 m)     Head Circumference --      Peak Flow --      Pain Score 01/21/19 1405 0     Pain Loc --      Pain Edu? --      Excl. in GC? --      Constitutional: Alert and oriented. Well appearing and in no acute distress. Eyes: Conjunctivae are normal. PERRL. EOMI. Head: Atraumatic. ENT:      Ears:      Nose: No congestion/rhinnorhea.      Mouth/Throat: Mucous membranes are moist.  Neck: No stridor.  Cardiovascular: Normal rate, regular rhythm.  Good peripheral circulation. Respiratory: Normal respiratory effort without tachypnea or retractions. Lungs CTAB. Good air entry to the bases with no decreased or absent breath  sounds. Musculoskeletal: Full range of motion to all extremities. No gross deformities appreciated. Neurologic:  Normal speech and language. No gross focal neurologic deficits are appreciated.  Skin:  Skin is warm, dry.  Excoriations and scabs to bilateral forearms.  Psychiatric: Mood and affect are normal. Speech and behavior are normal. Patient exhibits appropriate insight and judgement.   ____________________________________________   LABS (all labs ordered are listed, but only abnormal results are displayed)  Labs Reviewed - No data to display ____________________________________________  EKG   ____________________________________________  RADIOLOGY   No results found.  ____________________________________________    PROCEDURES  Procedure(s) performed:    Procedures    Medications - No data to display   ____________________________________________   INITIAL IMPRESSION / ASSESSMENT AND PLAN / ED COURSE  Pertinent labs & imaging results that were available during my care of the patient were reviewed by me and considered in my medical decision making (see chart for details).  Review of the [place] CSRS was performed in accordance of the [facility] prior to dispensing any controlled drugs.   Patient presents emergency department with concerns of rash to bilateral arms.  Vital signs and exam are reassuring.  It is possible that rash could be from a mite or scabies.  Patient's girlfriend was also able to identify a an insect to his arm earlier.  He will be covered with permethrin.  Patient will be discharged home with prescriptions for Keflex, permethrin, Benadryl. Patient is to follow up with wound care dermatology as directed. Patient is given ED precautions to return to the ED for any worsening or new symptoms.   Joe Maldonado was evaluated in Emergency Department on 01/22/2019 for the symptoms described in the history of present illness. He was evaluated in the context of  the global COVID-19 pandemic, which necessitated consideration that the patient might be at risk for infection with the SARS-CoV-2 virus that causes COVID-19. Institutional protocols and algorithms that pertain to the evaluation of patients at risk for COVID-19 are in a state of rapid change based on information released by regulatory bodies including the CDC and federal and state organizations. These policies and algorithms were followed during the patient's care in the ED.  ____________________________________________  FINAL CLINICAL IMPRESSION(S) / ED DIAGNOSES  Final diagnoses:  Rash      NEW MEDICATIONS STARTED DURING THIS VISIT:  ED Discharge Orders         Ordered    permethrin (ELIMITE) 5 % cream     01/21/19 1532    cephALEXin (KEFLEX) 500 MG capsule  4 times daily     01/21/19 1532    diphenhydrAMINE (  BENADRYL) 25 mg capsule  Every 4 hours PRN     01/21/19 1532              This chart was dictated using voice recognition software/Dragon. Despite best efforts to proofread, errors can occur which can change the meaning. Any change was purely unintentional.    Enid DerryWagner, Braeden Dolinski, PA-C 01/22/19 1214    Jene EveryKinner, Robert, MD 01/22/19 1341

## 2019-01-21 NOTE — ED Triage Notes (Signed)
Pt states he has had an itchy rash for the past 2 weeks and thought it was just poison ivy and today when he poured bleach on it bugs were crawling on his skin, thinks he had a parasite.

## 2019-01-21 NOTE — ED Notes (Signed)
Patient says he has rash on both arms. This am he saw little things coming out of the sores, so he poured bleach over his arms.  He also has one place on neck and one on his foot.  He is in nad.

## 2019-11-26 ENCOUNTER — Emergency Department
Admission: EM | Admit: 2019-11-26 | Discharge: 2019-11-26 | Disposition: A | Discharge status: Home / Self Care | Payer: Self-pay | Attending: Emergency Medicine | Admitting: Emergency Medicine

## 2019-11-26 ENCOUNTER — Other Ambulatory Visit: Payer: Self-pay

## 2019-11-26 DIAGNOSIS — W57XXXA Bitten or stung by nonvenomous insect and other nonvenomous arthropods, initial encounter: Secondary | ICD-10-CM | POA: Insufficient documentation

## 2019-11-26 DIAGNOSIS — S40862A Insect bite (nonvenomous) of left upper arm, initial encounter: Secondary | ICD-10-CM | POA: Insufficient documentation

## 2019-11-26 DIAGNOSIS — Y999 Unspecified external cause status: Secondary | ICD-10-CM | POA: Insufficient documentation

## 2019-11-26 DIAGNOSIS — F17210 Nicotine dependence, cigarettes, uncomplicated: Secondary | ICD-10-CM | POA: Insufficient documentation

## 2019-11-26 DIAGNOSIS — Y929 Unspecified place or not applicable: Secondary | ICD-10-CM | POA: Insufficient documentation

## 2019-11-26 DIAGNOSIS — Y939 Activity, unspecified: Secondary | ICD-10-CM | POA: Insufficient documentation

## 2019-11-26 MED ORDER — SULFAMETHOXAZOLE-TRIMETHOPRIM 800-160 MG PO TABS: 1.0000 | ORAL_TABLET | Freq: Two times a day (BID) | ORAL | 0 refills | Status: AC

## 2019-11-26 MED ORDER — HYDROXYZINE HCL 50 MG PO TABS: 50.0000 mg | ORAL_TABLET | Freq: Three times a day (TID) | ORAL | 0 refills | Status: AC | PRN

## 2019-11-26 NOTE — ED Notes (Signed)
See triage note  Presents with possible insect /spider bites   States he was cleaning out some property  He has several bites areas to arms ,legs and wrist  No fever

## 2019-11-26 NOTE — Discharge Instructions (Addendum)
Follow discharge care instruction take medication as directed. °

## 2019-11-26 NOTE — ED Triage Notes (Signed)
Pt c/o having 4-5 spider bites on his left arm for the past 2 weeks.

## 2019-11-26 NOTE — ED Provider Notes (Signed)
[facility] Emergency Department Provider Note   ____________________________________________   First MD Initiated Contact with Patient 11/26/19 [phone]     (approximate)  I have reviewed the triage vital signs and the nursing notes.   HISTORY  Chief Complaint Insect Bite    HPI Joe Maldonado is a 34 y.o. male patient presents with multiple bites on the left arm for 2 weeks.  Patient states he believes he was bit by "brown recluse".  Patient states is itching and some of the lesions are crusted over.  Denies fever/chills.  Denies headache, vision disturbance, vertigo, nausea or vomiting.         History reviewed. No pertinent past medical history.  There are no problems to display for this patient.   History reviewed. No pertinent surgical history.  Prior to Admission medications   Medication Sig Start Date End Date Taking? Authorizing Provider  hydrOXYzine (ATARAX/VISTARIL) 50 MG tablet Take 1 tablet (50 mg total) by mouth 3 (three) times daily as needed for itching. 11/26/19   Joni Reining, PA-C  sulfamethoxazole-trimethoprim (BACTRIM DS) 800-160 MG tablet Take 1 tablet by mouth 2 (two) times daily. 11/26/19   Joni Reining, PA-C    Allergies Patient has no known allergies.  No family history on file.  Social History Social History   Tobacco Use  . Smoking status: Current Every Day Smoker    Packs/day: 1.00    Types: Cigarettes  . Smokeless tobacco: Never Used  Substance Use Topics  . Alcohol use: Yes    Comment: occ  . Drug use: Not Currently    Comment: occ    Review of Systems Constitutional: No fever/chills Eyes: No visual changes. ENT: No sore throat. Cardiovascular: Denies chest pain. Respiratory: Denies shortness of breath. Gastrointestinal: No abdominal pain.  No nausea, no vomiting.  No diarrhea.  No constipation. Genitourinary: Negative for dysuria. Musculoskeletal: Negative for back pain. Skin: Positive for  rash. Neurological: Negative for headaches, focal weakness or numbness.   ____________________________________________   PHYSICAL EXAM:  VITAL SIGNS: ED Triage Vitals [11/26/19 0730]  Enc Vitals Group     BP 132/88     Pulse Rate 92     Resp 16     Temp 98.1 F (36.7 C)     Temp Source Oral     SpO2 97 %     Weight 150 lb (68 kg)     Height 6' (1.829 m)     Head Circumference      Peak Flow      Pain Score 0     Pain Loc      Pain Edu?      Excl. in GC?    Constitutional: Alert and oriented. Well appearing and in no acute distress. Cardiovascular: Normal rate, regular rhythm. Grossly normal heart sounds.  Good peripheral circulation. Respiratory: Normal respiratory effort.  No retractions. Lungs CTAB. Skin: Papular vesicular lesion on erythematous base left upper extremity.  Signs of excoriation. Psychiatric: Mood and affect are normal. Speech and behavior are normal.  ____________________________________________   LABS (all labs ordered are listed, but only abnormal results are displayed)  Labs Reviewed - No data to display ____________________________________________  EKG   ____________________________________________  RADIOLOGY  ED MD interpretation:    Official radiology report(s): No results found.  ____________________________________________   PROCEDURES  Procedure(s) performed (including Critical Care):  Procedures   ____________________________________________   INITIAL IMPRESSION / ASSESSMENT AND PLAN / ED COURSE  As part  of my medical decision making, I reviewed the following data within the electronic MEDICAL RECORD NUMBER     Patient presents with insect bites which developed into mild cellulitis of the left upper extremity.  Patient given discharge care instructions and a prescription for Bactrim DS and Atarax.  [facility].    Joe Maldonado was evaluated in Emergency Department on 11/26/2019  for the symptoms described in the history of present illness. He was evaluated in the context of the global COVID-19 pandemic, which necessitated consideration that the patient might be at risk for infection with the SARS-CoV-2 virus that causes COVID-19. Institutional protocols and algorithms that pertain to the evaluation of patients at risk for COVID-19 are in a state of rapid change based on information released by regulatory bodies including the CDC and federal and state organizations. These policies and algorithms were followed during the patient's care in the ED.       ____________________________________________   FINAL CLINICAL IMPRESSION(S) / ED DIAGNOSES  Final diagnoses:  Bug bite with infection, initial encounter     ED Discharge Orders         Ordered    sulfamethoxazole-trimethoprim (BACTRIM DS) 800-160 MG tablet  2 times daily     Discontinue  Reprint     11/26/19 0751    hydrOXYzine (ATARAX/VISTARIL) 50 MG tablet  3 times daily PRN     Discontinue  Reprint     11/26/19 0751           Note:  This document was prepared using Dragon voice recognition software and may include unintentional dictation errors.    Joni Reining, PA-C 11/26/19 8099    Concha Se, MD 11/26/19 [phone]

## 2019-12-09 ENCOUNTER — Telehealth: Payer: Self-pay | Admitting: General Practice

## 2019-12-09 NOTE — Telephone Encounter (Signed)
Individual has been contacted 3+ times regarding ED referral. No further attempts to contact individual will be made. 

## 2021-01-29 ENCOUNTER — Emergency Department
Admission: EM | Admit: 2021-01-29 | Discharge: 2021-01-29 | Disposition: A | Discharge status: Home / Self Care | Payer: Self-pay | Attending: Emergency Medicine | Admitting: Emergency Medicine

## 2021-01-29 ENCOUNTER — Other Ambulatory Visit: Payer: Self-pay

## 2021-01-29 DIAGNOSIS — R21 Rash and other nonspecific skin eruption: Secondary | ICD-10-CM | POA: Insufficient documentation

## 2021-01-29 DIAGNOSIS — F17210 Nicotine dependence, cigarettes, uncomplicated: Secondary | ICD-10-CM | POA: Insufficient documentation

## 2021-01-29 DIAGNOSIS — R369 Urethral discharge, unspecified: Secondary | ICD-10-CM | POA: Insufficient documentation

## 2021-01-29 MED ORDER — DOXYCYCLINE HYCLATE 100 MG PO CAPS: 100.0000 mg | ORAL_CAPSULE | Freq: Two times a day (BID) | ORAL | 0 refills | Status: AC

## 2021-01-29 MED ORDER — METRONIDAZOLE 500 MG PO TABS: 500.0000 mg | ORAL_TABLET | Freq: Two times a day (BID) | ORAL | 0 refills | Status: AC

## 2021-01-29 MED ORDER — CEFTRIAXONE SODIUM 1 G IJ SOLR
500.0000 mg | Freq: Once | INTRAMUSCULAR | Status: AC
  Administered 2021-01-29: 500 mg via INTRAMUSCULAR
  Filled 2021-01-29: qty 10

## 2021-01-29 MED ORDER — LIDOCAINE HCL (PF) 1 % IJ SOLN
1.0000 mL | Freq: Once | INTRAMUSCULAR | Status: AC
  Administered 2021-01-29: 1 mL
  Filled 2021-01-29: qty 5

## 2021-01-29 NOTE — ED Triage Notes (Signed)
Pt complains of red itchy rash to pelvis and left arm. Pt denies known fever, rash is not pox like in appearance. Pt states has been present for over a month.

## 2021-01-29 NOTE — Discharge Instructions (Signed)
As we discussed, continue to use some calamine lotion and/or hydrocortisone ointment on the rash on your arm and lower abdomen.  This should heal up over time.  For your penile discharge, you declined any testing at this time, but agreed with treatment.  We gave you an injection of antibiotics and I provided 2 prescriptions.  It is important that you take the full course of both prescriptions, one of them for 1 week and the other for 2 weeks.  This should treat you completely for gonorrhea, chlamydia, trichomoniasis, and syphilis.  You should start to show improvement within the next couple of days.  If you continue to have issues, please follow-up at the [facility] department.  Remember to not have sex until you complete treatment, and you can still be reinfected if you have unprotected sex with an infected partner.  Remember that you declined HIV testing tonight.

## 2021-01-29 NOTE — ED Provider Notes (Signed)
[facility] Emergency Department Provider Note  ____________________________________________   Event Date/Time   First MD Initiated Contact with Patient 01/29/21 [phone]     (approximate)  I have reviewed the triage vital signs and the nursing notes.   HISTORY  Chief Complaint Rash    HPI Joe Maldonado is a 35 y.o. male who presents for evaluation of a rash along his beltline in the middle of his lower abdomen as well as to his left arm.  However, he said he is not really worried about those, but he was embarrassed to tell the real complaint.  He is here because of 2 days of thick greenish discharge from his penis.  He was recently incarcerated and said "I wanted to get some" when he got out, so he had unprotected sex with a new partner.  The discharge started shortly thereafter.  He said it burns when he urinates and feels very irritated at the tip of his penis.  He has no lesions or sores.  No rash to the palms of his hands nor soles of his feet, and the rash on his abdomen and his arm has been there for months.  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  He is not concerned about these lesions.  He reports the discharge from his penis is severe and nothing in particular makes it better or worse.     No past medical history on file.  There are no problems to display for this patient.   No past surgical history on file.  Prior to Admission medications   Medication Sig Start Date End Date Taking? Authorizing Provider  doxycycline (VIBRAMYCIN) 100 MG capsule Take 1 capsule (100 mg total) by mouth 2 (two) times daily for 14 days. 01/29/21 02/12/21 Yes Loleta Rose, MD  metroNIDAZOLE (FLAGYL) 500 MG tablet Take 1 tablet (500 mg total) by mouth 2 (two) times daily for 7 days. 01/29/21 02/05/21 Yes Loleta Rose, MD  hydrOXYzine  (ATARAX/VISTARIL) 50 MG tablet Take 1 tablet (50 mg total) by mouth 3 (three) times daily as needed for itching. 11/26/19   Joni Reining, PA-C  sulfamethoxazole-trimethoprim (BACTRIM DS) 800-160 MG tablet Take 1 tablet by mouth 2 (two) times daily. 11/26/19   Joni Reining, PA-C    Allergies Patient has no known allergies.  No family history on file.  Social History Social History   Tobacco Use   Smoking status: Every Day    Packs/day: 1.00    Types: Cigarettes   Smokeless tobacco: Never  Substance Use Topics   Alcohol use: Yes    Comment: occ   Drug use: Not Currently    Comment: occ    Review of Systems Constitutional: No fever/chills Cardiovascular: Denies chest pain. Respiratory: Denies shortness of breath. Gastrointestinal: No abdominal pain.   Genitourinary: Positive for purulent penile discharge and some dysuria. Integumentary: Chronic rash on middle of lower abdomen and left arm Neurological: Negative for headaches, focal weakness or numbness.   ____________________________________________   PHYSICAL EXAM:  VITAL SIGNS: ED Triage Vitals  Enc Vitals Group     BP 01/29/21 0526 (!) 129/91     Pulse Rate 01/29/21 0526 89     Resp 01/29/21 0526 20     Temp 01/29/21 0526 98.4 F (36.9 C)     Temp Source 01/29/21 0526  Oral     SpO2 01/29/21 0526 97 %     Weight 01/29/21 0519 72.6 kg (160 lb)     Height 01/29/21 0519 1.829 m (6')     Head Circumference --      Peak Flow --      Pain Score 01/29/21 0519 0     Pain Loc --      Pain Edu? --      Excl. in GC? --     Constitutional: Alert and oriented.  Cardiovascular: Normal rate, regular rhythm. Good peripheral circulation. Respiratory: Normal respiratory effort.  No retractions. Genitourinary: Thick greenish-white discharge from the tip of the penis with erythema at the tip around the urethral meatus.  No chancre or chanchroid lesions. Neurologic:  Normal speech and language. No gross focal neurologic  deficits are appreciated.  Skin:  Skin is warm and dry.  He has scabbed over lesions around the area of his belt buckle that are consistent with a contact dermatitis.  He has a similar looking appearance to his left arm near the wrist and going partially up the forearm that he says was from poison ivy.  He has no lesions that appear consistent with monkeypox. Psychiatric: Mood and affect are normal. Speech and behavior are normal.  ____________________________________________     INITIAL IMPRESSION / MDM / ASSESSMENT AND PLAN / ED COURSE  As part of my medical decision making, I reviewed the following data within the electronic MEDICAL RECORD NUMBER Nursing notes reviewed and incorporated, Old chart reviewed, and Notes from prior ED visits   Differential diagnosis includes, but is not limited to, chlamydia, gonorrhea, trichomoniasis, syphilis, HIV.    The skin rash the patient reported is chronic and not consistent with an infectious process including monkeypox.  He is not concerned about it and was just embarrassed to tell the real reason for his visit in triage.  His physical exam is most suggestive of chlamydia but of course, infections are also very probable.  I offered to do a full battery of testing including RPR and HIV, but the patient declines all testing in favor of empiric treatment.  I reminded him that we do not know his HIV status and he said he does not care and will follow-up with [facility] department.  I treated with ceftriaxone 500 mg intramuscular with lidocaine.  I also provided prescription for doxycycline 100 mg p.o. twice daily x14 days, which, together with the ceftriaxone, should cover treating both gonorrhea and chlamydia, but the 2-week course of doxycycline is also an appropriate treatment for primary syphilis (even though I have a low suspicion that it is still reasonable to treat).  His physical exam again does not suggest secondary syphilis as he has no  lesions on the palms of his hands or soles of his feet.  I also am treating with metronidazole 500 mg p.o. twice daily x7 days for empiric treatment of trichomoniasis.  The patient [facility] and I had my usual customary STD discussion with him.           ____________________________________________  FINAL CLINICAL IMPRESSION(S) / ED DIAGNOSES  Final diagnoses:  Penile discharge  Rash and nonspecific skin eruption     MEDICATIONS GIVEN DURING THIS VISIT:  Medications  cefTRIAXone (ROCEPHIN) injection 500 mg (500 mg Intramuscular Given 01/29/21 0615)  lidocaine (PF) (XYLOCAINE) 1 % injection 1 mL (1 mL Other Given 01/29/21 2536)     ED Discharge Orders  Ordered    doxycycline (VIBRAMYCIN) 100 MG capsule  2 times daily        01/29/21 0622    metroNIDAZOLE (FLAGYL) 500 MG tablet  2 times daily        01/29/21 3729             Note:  This document was prepared using Dragon voice recognition software and may include unintentional dictation errors.   Loleta Rose, MD 01/29/21 [phone]
